# Patient Record
Sex: Female | Born: 1985 | Race: White | Hispanic: No | Marital: Single | Smoking: Never smoker
Health system: Southern US, Community
[De-identification: ages and names within clinical notes are randomized; demographics above are authoritative.]

## PROBLEM LIST (undated history)

## (undated) ENCOUNTER — Ambulatory Visit

## (undated) DIAGNOSIS — B001 Herpesviral vesicular dermatitis: Secondary | ICD-10-CM

## (undated) DIAGNOSIS — N200 Calculus of kidney: Secondary | ICD-10-CM

## (undated) DIAGNOSIS — M25561 Pain in right knee: Secondary | ICD-10-CM

## (undated) HISTORY — DX: Herpesviral vesicular dermatitis: B00.1

---
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
## 2005-03-16 ENCOUNTER — Emergency Department (HOSPITAL_COMMUNITY): Admission: EM | Admit: 2005-03-16 | Discharge: 2005-03-17 | Payer: Self-pay | Admitting: Emergency Medicine

## 2005-05-24 ENCOUNTER — Inpatient Hospital Stay (HOSPITAL_COMMUNITY): Admission: AD | Admit: 2005-05-24 | Discharge: 2005-05-24 | Payer: Self-pay | Admitting: Obstetrics and Gynecology

## 2005-09-18 ENCOUNTER — Emergency Department (HOSPITAL_COMMUNITY): Admission: EM | Admit: 2005-09-18 | Discharge: 2005-09-18 | Payer: Self-pay | Admitting: Emergency Medicine

## 2005-10-30 ENCOUNTER — Inpatient Hospital Stay (HOSPITAL_COMMUNITY): Admission: AD | Admit: 2005-10-30 | Discharge: 2005-10-30 | Payer: Self-pay | Admitting: Obstetrics and Gynecology

## 2006-01-04 ENCOUNTER — Inpatient Hospital Stay (HOSPITAL_COMMUNITY): Admission: AD | Admit: 2006-01-04 | Discharge: 2006-01-04 | Payer: Self-pay | Admitting: Obstetrics and Gynecology

## 2006-01-06 ENCOUNTER — Inpatient Hospital Stay (HOSPITAL_COMMUNITY): Admission: RE | Admit: 2006-01-06 | Discharge: 2006-01-08 | Payer: Self-pay | Admitting: Obstetrics & Gynecology

## 2006-01-22 ENCOUNTER — Encounter: Admission: RE | Admit: 2006-01-22 | Discharge: 2006-01-22 | Payer: Self-pay | Admitting: Emergency Medicine

## 2006-01-29 ENCOUNTER — Emergency Department (HOSPITAL_COMMUNITY): Admission: EM | Admit: 2006-01-29 | Discharge: 2006-01-29 | Payer: Self-pay | Admitting: Emergency Medicine

## 2006-06-25 ENCOUNTER — Emergency Department (HOSPITAL_COMMUNITY): Admission: EM | Admit: 2006-06-25 | Discharge: 2006-06-25 | Payer: Self-pay | Admitting: Emergency Medicine

## 2006-07-18 HISTORY — PX: BREAST ENHANCEMENT SURGERY: SHX7

## 2008-06-08 ENCOUNTER — Emergency Department (HOSPITAL_COMMUNITY): Admission: EM | Admit: 2008-06-08 | Discharge: 2008-06-08 | Payer: Self-pay | Admitting: Emergency Medicine

## 2010-12-03 NOTE — H&P (Signed)
[facility]  Patient:    [name], [name]                     MRN: [id number] Adm. Date:  [id number] Attending:  Genia Del                         History and Physical  DATE OF BIRTH:                Mar 18, 1986  INDICATIONS:                   [name] is a 25 year old G1, last menstrual period on February 18, 2000 for an expected date of delivery Nov 24, 2000 at 40 weeks and 6 days gestation.  REASON FOR ADMISSION:         Induction for post date.  HISTORY OF PRESENT ILLNESS:   Fetal movements positive.  No vaginal bleeding. No fluid leak.  No regular uterine contractions.  No PIH symptoms.  Last visit at the office showed a favorable cervix at 2+ cm, 80%, vertex, -1.  An ultrasound at 40 weeks showed appropriate for gestational age with amniotic fluid index within normal limits, cephalic presentation.  The decision was taken to proceed with induction for post date on Nov 30, 2000.  PAST MEDICAL HISTORY:         Positive for mild asthma.  PAST SURGICAL HISTORY:        Negative.  PAST GYNECOLOGIC HISTORY:     Negative.  No history of STDs.  Normal Paps.  FAMILY HISTORY:               Positive for bipolar disorder, thyroid dysfunction, and chronic hypertension.  MEDICATIONS:                  Prenatal vitamins.  ALLERGIES:                    No known drug allergies.  SOCIAL HISTORY:               Single but father of the baby is supportive. 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  HISTORY OF PRESENT PREGNANCY: First trimester was marked by nausea and vomiting, for which Phenergan was used.  Her labs in the first trimester showed a hemoglobin of 13.1, platelets 189, blood type Rh O positive, Rh antibody negative, RPR nonreactive, HBsAg negative, HIV nonreactive, rubella titers equivocal, gonorrhea and chlamydia negative, Pap test normal.  In the second trimester, she had a triple test within normal limits.   Ultrasound review of anatomy at [facility] was within normal limits with normal fluids and normal placenta.  A one-hour GTT at 27+ weeks was within normal limits and group B Strep was negative at 35+ weeks.  Blood pressures remained normal throughout pregnancy.  REVIEW OF SYSTEMS:            CONSTITUTIONAL:  Negative.  HEENT:  Negative. RESPIRATORY:  Negative.  CARDIOVASCULAR:  Negative.  GI:  Negative.  GU: Negative.  DERMATOLOGIC:  Negative.  NEUROLOGIC:  Negative.  ENDOCRINE: Negative.  PHYSICAL EXAMINATION:  GENERAL:                      No apparent distress.  VITAL SIGNS:                  Blood pressure on  admission was 120/75, pulse 86, respiratory rate 20, temperature 97.8.  LUNGS:                        Clear bilaterally.  HEART:                        Regular cardiac rhythm.  No murmur.  ABDOMEN:                      Soft, gravid uterus.  Cephalic presentation. Height corresponds well.  Vaginal exam:  2+ cm, 70% effaced, vertex, 0.  EXTREMITIES:                  Lower limbs:  Mild edema.  LABORATORY DATA:              Monitoring baseline 130 per minute with good variability and positive accelerations.  NST reactive.  No decelerations.  No regular uterine contractions.  IMPRESSION:                   G1 40 weeks and 6 days gestation.  Fetal well-being reassuring.  Appropriate for gestational age.  Induction for post date with favorable cervix.  PLAN:                         Induction with artificial rupture of membranes plus Pitocin, monitoring, expectant management towards probable vaginal delivery. DD:  11/30/00 TD:  11/30/00 Job: [id number]

## 2010-12-03 NOTE — Consult Note (Signed)
[name], [name]              ACCOUNT NO.:  [id number]   MEDICAL RECORD NO.:  [id number]          PATIENT TYPE:  MAT   LOCATION:  [facility]                          FACILITY:  [facility]   PHYSICIAN:  Lenoard Aden, M.D.DATE OF BIRTH:  07/28/1985   DATE OF CONSULTATION:  05/24/2005  DATE OF DISCHARGE:                                   CONSULTATION   CHIEF COMPLAINT:  Hyperemesis.   She is a 25 year old white female, G4, P1, who presents with nausea and  vomiting at [redacted] weeks gestation.   MEDICATIONS:  Emetrol and prenatal vitamins.   She is a nonsmoker, nondrinker, denies domestic or physical violence.  History of a vaginal delivery and 1 pregnancy loss.   FAMILY HISTORY:  Noncontributory.   SOCIAL HISTORY:  Noncontributory.   PHYSICAL EXAMINATION:  VITAL SIGNS:  Today stable, temperature 97.6, pulse  68, respirations 20, blood pressure 109/66.  HEENT:  Normal.  LUNGS:  Clear.  HEART:  Regular rhythm.  ABDOMEN:  Soft, nontender.  No CVA tenderness.  EXTREMITIES:  No cords.  NEUROLOGIC:  Nonfocal.  PELVIC EXAM:  Deferred.   IMPRESSION:  1.  Eight week intrauterine pregnancy.  2.  Hyperemesis.   PLAN:  IV fluids, IV Phenergan, discharge home, Phenergan prescription  called, reassurance given.      Lenoard Aden, M.D.  Electronically Signed     RJT/MEDQ  D:  05/24/2005  T:  05/24/2005  Job:  [id number]

## 2011-10-09 ENCOUNTER — Ambulatory Visit (INDEPENDENT_AMBULATORY_CARE_PROVIDER_SITE_OTHER): Payer: BC Managed Care – PPO | Admitting: Family Medicine

## 2011-10-09 VITALS — BP 112/69 | HR 92 | Temp 98.5°F | Resp 16 | Ht 66.5 in | Wt 155.8 lb

## 2011-10-09 DIAGNOSIS — J069 Acute upper respiratory infection, unspecified: Secondary | ICD-10-CM

## 2011-10-09 DIAGNOSIS — R05 Cough: Secondary | ICD-10-CM

## 2011-10-09 DIAGNOSIS — B9789 Other viral agents as the cause of diseases classified elsewhere: Secondary | ICD-10-CM

## 2011-10-09 DIAGNOSIS — J029 Acute pharyngitis, unspecified: Secondary | ICD-10-CM

## 2011-10-09 DIAGNOSIS — R5381 Other malaise: Secondary | ICD-10-CM

## 2011-10-09 LAB — POCT RAPID STREP A (OFFICE): Rapid Strep A Screen: NEGATIVE

## 2011-10-09 LAB — POCT INFLUENZA A/B: Influenza B, POC: NEGATIVE

## 2011-10-09 MED ORDER — MAGIC MOUTHWASH W/LIDOCAINE: 5.0000 mL | Freq: Four times a day (QID) | ORAL | Status: DC | PRN

## 2011-10-09 NOTE — Progress Notes (Signed)
  Patient Name: [name] Date of Birth: 07-29-1985 Medical Record Number: [id number] Gender: female Date of Encounter: 10/09/2011  History of Present Illness:  Amos Gaber is a 26 y.o. very pleasant female patient who presents with the following:  ST for 4 or 5 days.  Does have some allergies.  Now has sinus pressure and pain, left ear feels full and "like there is water" in there.  Notes that her chest feels tight.  No fever noted but has felt warm, has had chills.  Does have body aches.  Yesterday she felt like she might have the flu.  No antipyretics today.  No GI symptoms Menses now.    There is no problem list on file for this patient.  Past Medical History  Diagnosis Date  . Allergy   . Asthma    History reviewed. No pertinent past surgical history. History  Substance Use Topics  . Smoking status: Never Smoker   . Smokeless tobacco: Never Used  . Alcohol Use: Yes     occasionally   History reviewed. No pertinent family history. Allergies  Allergen Reactions  . Penicillins     childhood    Medication list has been reviewed and updated.  Review of Systems: As per HPI- otherwise negative.  Physical Examination: Filed Vitals:   10/09/11 1004  BP: 112/69  Pulse: 92  Temp: 98.5 F (36.9 C)  TempSrc: Oral  Resp: 16  Height: 5' 6.5" (1.689 m)  Weight: 155 lb 12.8 oz (70.67 kg)    Body mass index is 24.77 kg/(m^2).  GEN: WDWN, NAD, Non-toxic, A & O x 3 HEENT: Atraumatic, Normocephalic. Neck supple. No masses, No LAD.  Tm wnl bilaterally, left side of oropharynx a little bit injected but no exudate Ears and Nose: No external deformity. CV: RRR, No M/G/R. No JVD. No thrill. No extra heart sounds. PULM: CTA B, no wheezes, crackles, rhonchi. No retractions. No resp. distress. No accessory muscle use. ABD: S, NT, ND, +BS. No rebound. No HSM. EXTR: No c/c/e NEURO Normal gait.  PSYCH: Normally interactive. Conversant. Not depressed or anxious appearing.   Calm demeanor.   Results for orders placed in visit on 10/09/11  POCT INFLUENZA A/B      Component Value Range   Influenza A, POC Negative     Influenza B, POC Negative    POCT RAPID STREP A (OFFICE)      Component Value Range   Rapid Strep A Screen Negative  Negative     Assessment and Plan: 1. Malaise  POCT Influenza A/B, POCT rapid strep A  2. Cough    3. Sore throat  Alum & Mag Hydroxide-Simeth (MAGIC MOUTHWASH W/LIDOCAINE) SOLN   Suspect viral URI.  Symptomatic treatment as needed.  Patient (or parent if minor) instructed to return to clinic or call if not better in 2 day(s).

## 2011-10-12 ENCOUNTER — Ambulatory Visit (INDEPENDENT_AMBULATORY_CARE_PROVIDER_SITE_OTHER): Payer: BC Managed Care – PPO | Admitting: Internal Medicine

## 2011-10-12 VITALS — BP 98/94 | HR 64 | Temp 98.4°F | Resp 18 | Wt 156.0 lb

## 2011-10-12 DIAGNOSIS — H6690 Otitis media, unspecified, unspecified ear: Secondary | ICD-10-CM

## 2011-10-12 DIAGNOSIS — J45909 Unspecified asthma, uncomplicated: Secondary | ICD-10-CM

## 2011-10-12 DIAGNOSIS — H6692 Otitis media, unspecified, left ear: Secondary | ICD-10-CM

## 2011-10-12 MED ORDER — AMOXICILLIN 875 MG PO TABS: ORAL_TABLET | ORAL | Status: DC

## 2011-10-12 MED ORDER — METHYLPREDNISOLONE ACETATE 80 MG/ML IJ SUSP
80.0000 mg | Freq: Once | INTRAMUSCULAR | Status: AC
  Administered 2011-10-12: 80 mg via INTRAMUSCULAR

## 2011-10-12 MED ORDER — ALBUTEROL SULFATE HFA 108 (90 BASE) MCG/ACT IN AERS: 2.0000 | INHALATION_SPRAY | Freq: Four times a day (QID) | RESPIRATORY_TRACT | Status: AC | PRN

## 2011-10-12 MED ORDER — ALBUTEROL SULFATE (2.5 MG/3ML) 0.083% IN NEBU
2.5000 mg | INHALATION_SOLUTION | Freq: Once | RESPIRATORY_TRACT | Status: AC
  Administered 2011-10-12: 2.5 mg via RESPIRATORY_TRACT

## 2011-10-12 MED ORDER — PREDNISONE 20 MG PO TABS: ORAL_TABLET | ORAL | Status: DC

## 2011-10-12 NOTE — Progress Notes (Signed)
  Subjective:    Patient ID: [name], female    DOB: 1986/03/05, 26 y.o.   MRN: [id number]  Otalgia  There is pain in the left ear. This is a new problem. The current episode started yesterday. The problem occurs constantly. There has been no fever. Associated symptoms include coughing.  [name] comes in complaining of a worsening of her cough and severe left ear pain that started last evening about 7 pm.  She has a remote history of asthma, does not have an inhaler at home, has not needed one in recent years.  She has not had a fever or vomiting.  She also tells me she has taken Amoxicillin in recent years without it causing any side effects.    Review of Systems  HENT: Positive for ear pain.   Respiratory: Positive for cough.   All other systems reviewed and are negative.       Objective:   Physical Exam  Vitals reviewed. Constitutional: She is oriented to person, place, and time. She appears well-developed and well-nourished.  HENT:  Head: Normocephalic.  Mouth/Throat: Oropharynx is clear and moist. No oropharyngeal exudate.       Left TM with bullous myringitis  Neck: Neck supple.  Cardiovascular: Normal rate, regular rhythm and normal heart sounds.   Pulmonary/Chest: Effort normal and breath sounds normal.  Abdominal: Soft.  Lymphadenopathy:    She has no cervical adenopathy.  Neurological: She is alert and oriented to person, place, and time.  Skin: Skin is warm and dry.  Psychiatric: She has a normal mood and affect. Her behavior is normal.          Assessment & Plan:  Left Bullous Myringitis:  Depomedrol 80 mg IM now.  Amoxicillin 875 2 po BID for 10 days.  Prednisone taper 60-60-40-40-20-20.  RTC if not better in 2 days. RAD:  Albuterol inhaler given for cough.

## 2011-10-12 NOTE — Patient Instructions (Signed)
Take your prednisone taper with food as directed along with your AMoxicillin for 10 days.  You should be feeling better in 2 -3 days.  Return to our clinic if you are not feeling better.  Otitis Media, Adult A middle ear infection is an infection in the space behind the eardrum. It often happens along with a cold. It is caused by a germ that starts growing in that space. Your neck may feel puffy (swollen) on the side of the ear infection. HOME CARE  Take your medicine as told. Finish it even if you start to feel better.   Nose medicine (nasal decongestant) may help the tube that connects the ear and throat (eustachian tube) drain better. It may also help with discomfort.   Follow up with your doctor in 10 to 14 days or as told by your doctor. This is to make sure the infection is gone.  GET HELP RIGHT AWAY IF:   You do not start to feel better in 2 to 3 days.   You have pain that is not helped with medicine.   You cannot use the medicine as told.   You feel worse instead of better.   You develop puffiness, redness, or pain around the ear.   You get a stiff neck.  MAKE SURE YOU:   Understand these instructions.   Will watch your condition.   Will get help right away if you are not doing well or get worse.  Document Released: 12/21/2007 Document Revised: 06/23/2011 Document Reviewed: 12/21/2007 [facility] Patient Information 2012 [place], [place].

## 2012-02-09 ENCOUNTER — Other Ambulatory Visit: Payer: Self-pay | Admitting: Physician Assistant

## 2012-08-26 ENCOUNTER — Ambulatory Visit (INDEPENDENT_AMBULATORY_CARE_PROVIDER_SITE_OTHER): Payer: BC Managed Care – PPO | Admitting: Physician Assistant

## 2012-08-26 VITALS — BP 93/61 | HR 66 | Temp 97.4°F | Resp 16 | Ht 66.5 in | Wt 152.2 lb

## 2012-08-26 DIAGNOSIS — J019 Acute sinusitis, unspecified: Secondary | ICD-10-CM

## 2012-08-26 DIAGNOSIS — J309 Allergic rhinitis, unspecified: Secondary | ICD-10-CM

## 2012-08-26 DIAGNOSIS — J3489 Other specified disorders of nose and nasal sinuses: Secondary | ICD-10-CM

## 2012-08-26 DIAGNOSIS — R0981 Nasal congestion: Secondary | ICD-10-CM

## 2012-08-26 MED ORDER — AMOXICILLIN-POT CLAVULANATE 875-125 MG PO TABS: 1.0000 | ORAL_TABLET | Freq: Two times a day (BID) | ORAL | Status: DC

## 2012-08-26 MED ORDER — IPRATROPIUM BROMIDE 0.03 % NA SOLN: 2.0000 | Freq: Two times a day (BID) | NASAL | Status: DC

## 2012-08-26 MED ORDER — POLYMYXIN B-TRIMETHOPRIM 10000-0.1 UNIT/ML-% OP SOLN: 1.0000 [drp] | OPHTHALMIC | Status: DC

## 2012-08-26 NOTE — Progress Notes (Signed)
  Subjective:    Patient ID: [name], female    DOB: 10-21-1985, 27 y.o.   MRN: [id number]  HPI 27 year old female presents with 2 week history of nasal congestion, sinus pressure, dry, nonproductive cough, and thick nasal drainage.  She has been self treating at home with OTC cold preparations but symptoms have continued to worsen. Denies fever, chills, nausea, vomiting, headache, dizziness, otalgia, or abdominal pain.  She does admit that this morning her eyes were matted shut and have continued to drain purulent drainage since then.  She is otherwise healthy. Is taking Accutane for acne and doing well on it.  No other known medical problems.      Review of Systems  Constitutional: Negative for fever and chills.  HENT: Positive for congestion, rhinorrhea and postnasal drip. Negative for ear pain, sore throat and neck pain.   Eyes: Positive for pain and discharge. Negative for photophobia and visual disturbance.  Respiratory: Positive for cough. Negative for shortness of breath and wheezing.   Cardiovascular: Negative for chest pain.  Gastrointestinal: Negative for nausea, vomiting and abdominal pain.  Neurological: Negative for dizziness, light-headedness and headaches.  All other systems reviewed and are negative.       Objective:   Physical Exam  Constitutional: She is oriented to person, place, and time. She appears well-developed and well-nourished.  HENT:  Head: Normocephalic and atraumatic.  Right Ear: Tympanic membrane, external ear and ear canal normal.  Left Ear: Hearing, tympanic membrane, external ear and ear canal normal.  Nose: Right sinus exhibits maxillary sinus tenderness. Right sinus exhibits no frontal sinus tenderness. Left sinus exhibits maxillary sinus tenderness. Left sinus exhibits no frontal sinus tenderness.  Mouth/Throat: Uvula is midline, oropharynx is clear and moist and mucous membranes are normal. No oropharyngeal exudate, posterior oropharyngeal  edema, posterior oropharyngeal erythema or tonsillar abscesses.  Eyes: Conjunctivae are normal.  Neck: Normal range of motion.  Cardiovascular: Normal rate, regular rhythm and normal heart sounds.   Pulmonary/Chest: Effort normal and breath sounds normal.  Lymphadenopathy:    She has no cervical adenopathy.  Neurological: She is alert and oriented to person, place, and time.  Psychiatric: She has a normal mood and affect. Her behavior is normal. Judgment and thought content normal.          Assessment & Plan:   1. Nasal congestion  trimethoprim-polymyxin b (POLYTRIM) ophthalmic solution   trimethoprim-polymyxin b (POLYTRIM) ophthalmic solution   ipratropium (ATROVENT) 0.03 % nasal spray  2. Acute sinus infection  amoxicillin-clavulanate (AUGMENTIN) 875-125 MG per tablet   amoxicillin-clavulanate (AUGMENTIN) 875-125 MG per tablet  3. Allergic rhinitis     Will treat for sinus infection - per patient she is able to tolerate Augmentin without a problem.  Start Augmentin 875 mg bid Atrovent NS bid for congestion Patient not a contact wearer so will start Polytrim as directed She does admit to seasonal allergies but is not currently treating them at this time. I have instructed her to start a daily zytec, claritin, or allegra. If symptoms persist ok to send rx for flonase to try.   Follow up if symptoms worsen or fail to improve.

## 2014-05-18 DIAGNOSIS — M25561 Pain in right knee: Secondary | ICD-10-CM

## 2014-05-18 HISTORY — DX: Pain in right knee: M25.561

## 2014-05-30 ENCOUNTER — Other Ambulatory Visit: Payer: Self-pay | Admitting: Orthopedic Surgery

## 2014-05-30 ENCOUNTER — Encounter (HOSPITAL_BASED_OUTPATIENT_CLINIC_OR_DEPARTMENT_OTHER): Payer: Self-pay | Admitting: *Deleted

## 2014-06-02 ENCOUNTER — Ambulatory Visit (HOSPITAL_BASED_OUTPATIENT_CLINIC_OR_DEPARTMENT_OTHER): Payer: BC Managed Care – PPO | Admitting: Anesthesiology

## 2014-06-02 ENCOUNTER — Ambulatory Visit (HOSPITAL_BASED_OUTPATIENT_CLINIC_OR_DEPARTMENT_OTHER)
Admission: RE | Admit: 2014-06-02 | Discharge: 2014-06-02 | Disposition: A | Discharge status: Home / Self Care | Payer: BC Managed Care – PPO | Source: Ambulatory Visit | Attending: Orthopedic Surgery | Admitting: Orthopedic Surgery

## 2014-06-02 ENCOUNTER — Encounter (HOSPITAL_BASED_OUTPATIENT_CLINIC_OR_DEPARTMENT_OTHER)
Admission: RE | Disposition: A | Discharge status: Home / Self Care | Payer: Self-pay | Source: Ambulatory Visit | Attending: Orthopedic Surgery

## 2014-06-02 ENCOUNTER — Encounter (HOSPITAL_BASED_OUTPATIENT_CLINIC_OR_DEPARTMENT_OTHER): Payer: Self-pay

## 2014-06-02 DIAGNOSIS — M6751 Plica syndrome, right knee: Secondary | ICD-10-CM | POA: Insufficient documentation

## 2014-06-02 DIAGNOSIS — M94261 Chondromalacia, right knee: Secondary | ICD-10-CM | POA: Insufficient documentation

## 2014-06-02 DIAGNOSIS — Z880 Allergy status to penicillin: Secondary | ICD-10-CM | POA: Diagnosis not present

## 2014-06-02 HISTORY — PX: CHONDROPLASTY: SHX5177

## 2014-06-02 HISTORY — PX: KNEE ARTHROSCOPY WITH EXCISION PLICA: SHX5647

## 2014-06-02 HISTORY — DX: Pain in right knee: M25.561

## 2014-06-02 LAB — POCT HEMOGLOBIN-HEMACUE: Hemoglobin: 13.7 g/dL (ref 12.0–15.0)

## 2014-06-02 SURGERY — CHONDROPLASTY
Anesthesia: General | Laterality: Right

## 2014-06-02 MED ORDER — HYDROMORPHONE HCL 1 MG/ML IJ SOLN
INTRAMUSCULAR | Status: AC
  Filled 2014-06-02: qty 1

## 2014-06-02 MED ORDER — FENTANYL CITRATE 0.05 MG/ML IJ SOLN
INTRAMUSCULAR | Status: DC | PRN
  Administered 2014-06-02: 100 ug via INTRAVENOUS
  Administered 2014-06-02: 50 ug via INTRAVENOUS

## 2014-06-02 MED ORDER — LACTATED RINGERS IV SOLN
INTRAVENOUS | Status: DC
  Administered 2014-06-02 (×2): via INTRAVENOUS
  Administered 2014-06-02: 10 mL/h via INTRAVENOUS

## 2014-06-02 MED ORDER — LIDOCAINE HCL (CARDIAC) 20 MG/ML IV SOLN
INTRAVENOUS | Status: DC | PRN
  Administered 2014-06-02: 100 mg via INTRAVENOUS

## 2014-06-02 MED ORDER — CLINDAMYCIN PHOSPHATE 900 MG/50ML IV SOLN
900.0000 mg | INTRAVENOUS | Status: AC
  Administered 2014-06-02: 900 mg via INTRAVENOUS

## 2014-06-02 MED ORDER — SCOPOLAMINE 1 MG/3DAYS TD PT72
MEDICATED_PATCH | TRANSDERMAL | Status: AC
  Filled 2014-06-02: qty 1

## 2014-06-02 MED ORDER — MIDAZOLAM HCL 2 MG/2ML IJ SOLN
INTRAMUSCULAR | Status: AC
  Filled 2014-06-02: qty 2

## 2014-06-02 MED ORDER — FENTANYL CITRATE 0.05 MG/ML IJ SOLN: 50.0000 ug | INTRAMUSCULAR | Status: DC | PRN

## 2014-06-02 MED ORDER — EPINEPHRINE HCL 1 MG/ML IJ SOLN
INTRAMUSCULAR | Status: AC
  Filled 2014-06-02: qty 1

## 2014-06-02 MED ORDER — MIDAZOLAM HCL 2 MG/2ML IJ SOLN: 1.0000 mg | INTRAMUSCULAR | Status: DC | PRN

## 2014-06-02 MED ORDER — ONDANSETRON HCL 4 MG/2ML IJ SOLN: 4.0000 mg | Freq: Once | INTRAMUSCULAR | Status: DC | PRN

## 2014-06-02 MED ORDER — ONDANSETRON HCL 4 MG/2ML IJ SOLN
INTRAMUSCULAR | Status: DC | PRN
Start: 2014-06-02 — End: 2014-06-02
  Administered 2014-06-02: 4 mg via INTRAVENOUS

## 2014-06-02 MED ORDER — MIDAZOLAM HCL 5 MG/5ML IJ SOLN
INTRAMUSCULAR | Status: DC | PRN
  Administered 2014-06-02: 2 mg via INTRAVENOUS

## 2014-06-02 MED ORDER — ACETAMINOPHEN 500 MG PO TABS
1000.0000 mg | ORAL_TABLET | Freq: Four times a day (QID) | ORAL | Status: AC | PRN
  Administered 2014-06-02: 1000 mg via ORAL

## 2014-06-02 MED ORDER — PROPOFOL 10 MG/ML IV BOLUS
INTRAVENOUS | Status: AC
  Filled 2014-06-02: qty 20

## 2014-06-02 MED ORDER — HYDROMORPHONE HCL 1 MG/ML IJ SOLN
0.2500 mg | INTRAMUSCULAR | Status: DC | PRN
  Administered 2014-06-02 (×3): 0.5 mg via INTRAVENOUS

## 2014-06-02 MED ORDER — OXYCODONE-ACETAMINOPHEN 5-325 MG PO TABS: 1.0000 | ORAL_TABLET | Freq: Four times a day (QID) | ORAL | Status: DC | PRN

## 2014-06-02 MED ORDER — BUPIVACAINE HCL (PF) 0.5 % IJ SOLN
INTRAMUSCULAR | Status: AC
  Filled 2014-06-02: qty 30

## 2014-06-02 MED ORDER — SCOPOLAMINE 1 MG/3DAYS TD PT72
1.0000 | MEDICATED_PATCH | TRANSDERMAL | Status: DC
Start: 2014-06-02 — End: 2014-06-02

## 2014-06-02 MED ORDER — EPINEPHRINE HCL 1 MG/ML IJ SOLN
INTRAMUSCULAR | Status: DC | PRN
  Administered 2014-06-02: 1 mg

## 2014-06-02 MED ORDER — OXYCODONE HCL 5 MG/5ML PO SOLN: 5.0000 mg | Freq: Once | ORAL | Status: DC | PRN

## 2014-06-02 MED ORDER — FENTANYL CITRATE 0.05 MG/ML IJ SOLN
INTRAMUSCULAR | Status: AC
  Filled 2014-06-02: qty 4

## 2014-06-02 MED ORDER — BUPIVACAINE HCL (PF) 0.5 % IJ SOLN
INTRAMUSCULAR | Status: DC | PRN
  Administered 2014-06-02: 20 mL

## 2014-06-02 MED ORDER — KETOROLAC TROMETHAMINE 30 MG/ML IJ SOLN
INTRAMUSCULAR | Status: DC | PRN
  Administered 2014-06-02: 30 mg via INTRAVENOUS

## 2014-06-02 MED ORDER — SODIUM CHLORIDE 0.9 % IR SOLN
Status: DC | PRN
  Administered 2014-06-02: 3000 mL

## 2014-06-02 MED ORDER — DEXAMETHASONE SODIUM PHOSPHATE 4 MG/ML IJ SOLN
INTRAMUSCULAR | Status: DC | PRN
  Administered 2014-06-02: 10 mg via INTRAVENOUS

## 2014-06-02 MED ORDER — POVIDONE-IODINE 7.5 % EX SOLN: Freq: Once | CUTANEOUS | Status: DC

## 2014-06-02 MED ORDER — MIDAZOLAM HCL 2 MG/ML PO SYRP: 12.0000 mg | ORAL_SOLUTION | Freq: Once | ORAL | Status: DC | PRN

## 2014-06-02 MED ORDER — PROPOFOL 10 MG/ML IV BOLUS
INTRAVENOUS | Status: DC | PRN
  Administered 2014-06-02: 250 mg via INTRAVENOUS

## 2014-06-02 MED ORDER — ACETAMINOPHEN 500 MG PO TABS
ORAL_TABLET | ORAL | Status: AC
  Filled 2014-06-02: qty 2

## 2014-06-02 MED ORDER — OXYCODONE HCL 5 MG PO TABS: 5.0000 mg | ORAL_TABLET | Freq: Once | ORAL | Status: DC | PRN

## 2014-06-02 SURGICAL SUPPLY — 43 items
BANDAGE ELASTIC 4 VELCRO ST LF (GAUZE/BANDAGES/DRESSINGS) ×2 IMPLANT
BANDAGE ELASTIC 6 VELCRO ST LF (GAUZE/BANDAGES/DRESSINGS) ×3 IMPLANT
BLADE 4.2CUDA (BLADE) IMPLANT
BLADE GREAT WHITE 4.2 (BLADE) ×2 IMPLANT
BLADE GREAT WHITE 4.2MM (BLADE) ×1
BNDG COHESIVE 4X5 TAN STRL (GAUZE/BANDAGES/DRESSINGS) ×2 IMPLANT
CANISTER SUCT 3000ML (MISCELLANEOUS) IMPLANT
CUTTER MENISCUS  4.2MM (BLADE)
CUTTER MENISCUS 4.2MM (BLADE) IMPLANT
DRAPE ARTHROSCOPY W/POUCH 114 (DRAPES) ×3 IMPLANT
DRSG EMULSION OIL 3X3 NADH (GAUZE/BANDAGES/DRESSINGS) ×3 IMPLANT
DURAPREP 26ML APPLICATOR (WOUND CARE) ×3 IMPLANT
ELECT MENISCUS 165MM 90D (ELECTRODE) IMPLANT
ELECT REM PT RETURN 9FT ADLT (ELECTROSURGICAL)
ELECTRODE REM PT RTRN 9FT ADLT (ELECTROSURGICAL) IMPLANT
GAUZE SPONGE 4X4 12PLY STRL (GAUZE/BANDAGES/DRESSINGS) ×3 IMPLANT
GLOVE BIOGEL PI IND STRL 8 (GLOVE) ×2 IMPLANT
GLOVE BIOGEL PI INDICATOR 8 (GLOVE) ×4
GLOVE ECLIPSE 7.5 STRL STRAW (GLOVE) ×6 IMPLANT
GOWN STRL REUS W/ TWL LRG LVL3 (GOWN DISPOSABLE) ×1 IMPLANT
GOWN STRL REUS W/ TWL XL LVL3 (GOWN DISPOSABLE) ×1 IMPLANT
GOWN STRL REUS W/TWL LRG LVL3 (GOWN DISPOSABLE) ×3
GOWN STRL REUS W/TWL XL LVL3 (GOWN DISPOSABLE) ×6 IMPLANT
HOLDER KNEE FOAM BLUE (MISCELLANEOUS) ×3 IMPLANT
KNEE WRAP E Z 3 GEL PACK (MISCELLANEOUS) ×3 IMPLANT
MANIFOLD NEPTUNE II (INSTRUMENTS) ×2 IMPLANT
NDL SAFETY ECLIPSE 18X1.5 (NEEDLE) IMPLANT
NEEDLE HYPO 18GX1.5 SHARP (NEEDLE)
PACK ARTHROSCOPY DSU (CUSTOM PROCEDURE TRAY) ×3 IMPLANT
PACK BASIN DAY SURGERY FS (CUSTOM PROCEDURE TRAY) ×3 IMPLANT
PAD CAST 4YDX4 CTTN HI CHSV (CAST SUPPLIES) ×1 IMPLANT
PADDING CAST ABS 4INX4YD NS (CAST SUPPLIES) ×2
PADDING CAST ABS COTTON 4X4 ST (CAST SUPPLIES) IMPLANT
PADDING CAST COTTON 4X4 STRL (CAST SUPPLIES) ×3
PENCIL BUTTON HOLSTER BLD 10FT (ELECTRODE) IMPLANT
SET ARTHROSCOPY TUBING (MISCELLANEOUS) ×3
SET ARTHROSCOPY TUBING LN (MISCELLANEOUS) ×1 IMPLANT
SPONGE GAUZE 4X4 12PLY STER LF (GAUZE/BANDAGES/DRESSINGS) ×2 IMPLANT
SUT ETHILON 4 0 PS 2 18 (SUTURE) IMPLANT
SYR 5ML LL (SYRINGE) ×3 IMPLANT
TOWEL OR 17X24 6PK STRL BLUE (TOWEL DISPOSABLE) ×3 IMPLANT
TOWEL OR NON WOVEN STRL DISP B (DISPOSABLE) ×3 IMPLANT
WATER STERILE IRR 1000ML POUR (IV SOLUTION) ×3 IMPLANT

## 2014-06-02 NOTE — Transfer of Care (Signed)
Immediate Anesthesia Transfer of Care Note  Patient: [name]  Procedure(s) Performed: Procedure(s) with comments: ARTHROSCOPY KNEE (Right) - Right knee arthroscopy CHONDROPLASTY lateral medial condyle (Right)  Patient Location: PACU  Anesthesia Type:General  Level of Consciousness: awake and sedated  Airway & Oxygen Therapy: Patient Spontanous Breathing and Patient connected to face mask oxygen  Post-op Assessment: Report given to PACU RN and Post -op Vital signs reviewed and stable  Post vital signs: Reviewed and stable  Complications: No apparent anesthesia complications

## 2014-06-02 NOTE — Brief Op Note (Signed)
06/02/2014  3:54 PM  PATIENT:  [name]  28 y.o. female  PRE-OPERATIVE DIAGNOSIS:  Right knee pain  POST-OPERATIVE DIAGNOSIS:  Right knee pain  PROCEDURE:  Procedure(s) with comments: ARTHROSCOPY KNEE (Right) - Right knee arthroscopy CHONDROPLASTY lateral medial condyle (Right)  SURGEON:  Surgeon(s) and Role:    * Harvie JuniorJohn L Deepak Bless, MD - Primary  PHYSICIAN ASSISTANT:   ASSISTANTS: bethune   ANESTHESIA:   general  EBL:  Total I/O In: 700 [I.V.:700] Out: -   BLOOD ADMINISTERED:none  DRAINS: none   LOCAL MEDICATIONS USED:  MARCAINE     SPECIMEN:  No Specimen  DISPOSITION OF SPECIMEN:  N/A  COUNTS:  YES  TOURNIQUET:  * No tourniquets in log *  DICTATION: .Other Dictation: Dictation Number missed dict number  PLAN OF CARE: Discharge to home after PACU  PATIENT DISPOSITION:  PACU - hemodynamically stable.   Delay start of Pharmacological VTE agent (>24hrs) due to surgical blood loss or risk of bleeding: no

## 2014-06-02 NOTE — Pre-Procedure Instructions (Signed)
Pt. called to say that her period started today; will cancel pregnancy test.

## 2014-06-02 NOTE — Anesthesia Preprocedure Evaluation (Signed)
Anesthesia Evaluation  Patient identified by MRN, date of birth, ID band Patient awake    Reviewed: Allergy & Precautions, H&P , NPO status , Patient's Chart, lab work & pertinent test results, Unable to perform ROS - Chart review only  Airway Mallampati: I  TM Distance: >3 FB Neck ROM: Full    Dental  (+) Teeth Intact, Dental Advisory Given   Pulmonary  breath sounds clear to auscultation        Cardiovascular Rhythm:Regular Rate:Normal     Neuro/Psych    GI/Hepatic   Endo/Other    Renal/GU      Musculoskeletal   Abdominal   Peds  Hematology   Anesthesia Other Findings   Reproductive/Obstetrics                             Anesthesia Physical Anesthesia Plan  ASA: I  Anesthesia Plan: General   Post-op Pain Management:    Induction: Intravenous  Airway Management Planned: LMA  Additional Equipment:   Intra-op Plan:   Post-operative Plan: Extubation in OR  Informed Consent: I have reviewed the patients History and Physical, chart, labs and discussed the procedure including the risks, benefits and alternatives for the proposed anesthesia with the patient or authorized representative who has indicated his/her understanding and acceptance.   Dental advisory given  Plan Discussed with: CRNA, Anesthesiologist and Surgeon  Anesthesia Plan Comments:         Anesthesia Quick Evaluation

## 2014-06-02 NOTE — Discharge Instructions (Signed)

## 2014-06-02 NOTE — H&P (Signed)
PREOPERATIVE H&P  Chief Complaint: r knee pain  HPI: [name] is a 28 y.o. female who presents for evaluation of r knee pain. It has been present for greater than 3 months and has been worsening. She has failed conservative measures. Pain is rated as moderate.  Past Medical History  Diagnosis Date  . Knee pain, right 05/2014   Past Surgical History  Procedure Laterality Date  . Breast enhancement surgery Bilateral 2008   History   Social History  . Marital Status: Married    Spouse Name: N/A    Number of Children: N/A  . Years of Education: N/A   Social History Main Topics  . Smoking status: Never Smoker   . Smokeless tobacco: Never Used  . Alcohol Use: No  . Drug Use: No  . Sexual Activity: None   Other Topics Concern  . None   Social History Narrative   History reviewed. No pertinent family history. Allergies  Allergen Reactions  . Penicillins Hives   Prior to Admission medications   Medication Sig Start Date End Date Taking? Authorizing Provider  acetaminophen (TYLENOL) 325 MG tablet Take 650 mg by mouth every 6 (six) hours as needed.   Yes Historical Provider, MD     Positive ROS: none  All other systems have been reviewed and were otherwise negative with the exception of those mentioned in the HPI and as above.  Physical Exam: Filed Vitals:   06/02/14 1244  BP: 117/69  Pulse: 64  Temp: 98.4 F (36.9 C)  Resp: 20    General: Alert, no acute distress Cardiovascular: No pedal edema Respiratory: No cyanosis, no use of accessory musculature GI: No organomegaly, abdomen is soft and non-tender Skin: No lesions in the area of chief complaint Neurologic: Sensation intact distally Psychiatric: Patient is competent for consent with normal mood and affect Lymphatic: No axillary or cervical lymphadenopathy  MUSCULOSKELETAL: r knee: painful rom Med jt line tender // no instability  Assessment/Plan: Right knee pain Plan for  Procedure(s): ARTHROSCOPY KNEE  The risks benefits and alternatives were discussed with the patient including but not limited to the risks of nonoperative treatment, versus surgical intervention including infection, bleeding, nerve injury, malunion, nonunion, hardware prominence, hardware failure, need for hardware removal, blood clots, cardiopulmonary complications, morbidity, mortality, among others, and they were willing to proceed.  Predicted outcome is good, although there will be at least a six to nine month expected recovery.  Angelea Penny L, MD 06/02/2014 3:03 PM

## 2014-06-02 NOTE — Anesthesia Postprocedure Evaluation (Signed)
Anesthesia Post Note  Patient: [name]  Procedure(s) Performed: Procedure(s) (LRB): CHONDROPLASTY lateral medial condyle (Right) KNEE ARTHROSCOPY WITH EXCISION PLICA (Right)  Anesthesia type: General  Patient location: PACU  Post pain: Pain level controlled and Adequate analgesia  Post assessment: Post-op Vital signs reviewed, Patient's Cardiovascular Status Stable, Respiratory Function Stable, Patent Airway and Pain level controlled  Last Vitals:  Filed Vitals:   06/02/14 1630  BP: 112/70  Pulse: 61  Temp:   Resp: 11    Post vital signs: Reviewed and stable  Level of consciousness: awake, alert  and oriented  Complications: No apparent anesthesia complications

## 2014-06-02 NOTE — Anesthesia Procedure Notes (Signed)
Procedure Name: LMA Insertion Performed by: Jhalil Silvera W Pre-anesthesia Checklist: Patient identified, Timeout performed, Emergency Drugs available, Suction available and Patient being monitored Patient Re-evaluated:Patient Re-evaluated prior to inductionOxygen Delivery Method: Circle system utilized Preoxygenation: Pre-oxygenation with 100% oxygen Intubation Type: IV induction Ventilation: Mask ventilation without difficulty LMA: LMA inserted LMA Size: 4.0 Number of attempts: 1 Placement Confirmation: positive ETCO2 Tube secured with: Tape Dental Injury: Teeth and Oropharynx as per pre-operative assessment      

## 2014-06-03 ENCOUNTER — Encounter (HOSPITAL_BASED_OUTPATIENT_CLINIC_OR_DEPARTMENT_OTHER): Payer: Self-pay | Admitting: Orthopedic Surgery

## 2014-06-03 NOTE — Op Note (Signed)
NAMJunious Silk:  [name], [name]              ACCOUNT NO.:  [id number]  MEDICAL RECORD NO.:  [id number]  LOCATION:                                 FACILITY:  PHYSICIAN:  [name], M.D.        DATE OF BIRTH:  DATE OF PROCEDURE:  06/02/2014 DATE OF DISCHARGE:  06/02/2014                              OPERATIVE REPORT   PREOPERATIVE DIAGNOSIS:  Chondral flap with questionable plica.  POSTOPERATIVE DIAGNOSES: 1. Lateral condyle condylar flap. 2. Medial and lateral shelf plica.  PRINCIPAL PROCEDURES: 1. Chondroplasty of lateral femoral condyle. 2. Medial and lateral plica excision.  SURGEON:  [name], M.D.  ASSISTANT:  [name], P.A.  ANESTHESIA:  General.  BRIEF HISTORY:  [name] is a 28 year old female with history of having significant complaints of right knee pain.  She had been treated conservatively for prolonged period of time.  Injection therapy had failed activity modification and medication had failed, and after failure of conservative care and continued locking and catching, she was taken to the operating room for right knee surgery.  DESCRIPTION OF PROCEDURE:  The patient was taken to the operating room. After adequate anesthesia was obtained with general anesthetic, the patient was placed supine on the operating table.  The right leg was prepped and draped in usual sterile fashion.  Following this, routine arthroscopic examination of the knee revealed that there was no significant chondromalacia of the patellofemoral joint.  There was a large medial shelf plica, which was draping and injuring the medial femoral condyle.  This was debrided back to a smooth and stable rim on the medial capsule.  Attention was turned to the medial meniscus, which look like has had some injury posteriorly, but did not appear as though it was detached at all, did not have an undersurface rent.  At this point, we probed it thoroughly and felt that it was satisfactory. Probed  the medial femoral condyle at length, no evidence of loose flap or chondral flap.  Attention was turned to the lateral compartment where the lateral femoral condyle had a cartilaginous flap, which was debrided back to a smooth and stable rim.  The lateral tibial plateau had some mild cartilage irregularity as well.  Lateral meniscus was normal. Attention was turned back up to the patellofemoral joint where the lateral plica was encountered and this was debrided back to a smooth and stable rim.  Once this was completed, the knee was copiously and thoroughly lavaged, suctioned, dried.  The arthroscopic portals were closed with bandage.  Sterile compressive dressing was applied.  The patient was taken to the recovery, she was noted to be in satisfactory condition.  20 mL of 1.25% Marcaine was instilled in the knee for postoperative anesthesia.     Tammy JuniorJohn L. Imelda Dandridge, M.D.     Ranae PlumberJLG/MEDQ  D:  06/02/2014  T:  06/03/2014  Job:  [id number]

## 2014-12-25 ENCOUNTER — Emergency Department (HOSPITAL_COMMUNITY)
Admission: EM | Admit: 2014-12-25 | Discharge: 2014-12-25 | Disposition: A | Discharge status: Home / Self Care | Payer: Self-pay | Attending: Emergency Medicine | Admitting: Emergency Medicine

## 2014-12-25 ENCOUNTER — Encounter (HOSPITAL_COMMUNITY): Payer: Self-pay | Admitting: Emergency Medicine

## 2014-12-25 DIAGNOSIS — Z880 Allergy status to penicillin: Secondary | ICD-10-CM | POA: Insufficient documentation

## 2014-12-25 DIAGNOSIS — L259 Unspecified contact dermatitis, unspecified cause: Secondary | ICD-10-CM | POA: Insufficient documentation

## 2014-12-25 MED ORDER — PREDNISONE 20 MG PO TABS
ORAL_TABLET | ORAL | Status: DC
Start: 2014-12-25 — End: 2016-06-18

## 2014-12-25 MED ORDER — DIPHENHYDRAMINE HCL 25 MG PO TABS: 25.0000 mg | ORAL_TABLET | Freq: Three times a day (TID) | ORAL | Status: DC | PRN

## 2014-12-25 NOTE — ED Notes (Signed)
Pt has rash/bumps to bilateral legs and arms. Pt c/o itching and painful to touch.

## 2014-12-25 NOTE — Discharge Instructions (Signed)

## 2014-12-25 NOTE — ED Provider Notes (Signed)
CSN: [id number]     Arrival date & time 12/25/14  1404 History  This chart was scribed for non-physician practitioner Fayrene Helper, PA, working with Richardean Canal, MD, by Tanda Rockers, ED Scribe. This patient was seen in room WTR6/WTR6 and the patient's care was started at 2:46 PM.    Chief Complaint  Patient presents with  . Rash   The history is provided by the patient. No language interpreter was used.     HPI Comments: [name] is a 29 y.o. female who presents to the Emergency Department complaining of rash to BLEs and BUEs x 4 days. Pt notes that she had itching to the areas this weekend but that the rash started afterwards. She describes the rash as itching, burning, and painful to the touch. Pt believes the rash started on her legs and spread to her arms. She notes mild drainage to the rash with yellow purulence. She mentions that her husband had poison ivy exposure approximately 2 weeks ago after working in the yard but that his rash has since resolved on its own. She notes that she was also working outside at that time but did not have any symptoms until recently. She was wearing a tanktop and yoga pants at that time but has not worn these articles of clothing since. Husband was sleeping in a separate room and using separate linens to avoid exposure. Pt has tried Calamine lotion, washing with soap, and taking Benadryl without relief. Denies any recent changes in lotions, soaps, body wash, creams, or medications. No recent foregin travel or recent exposure to bodies of water. No rash to palms of hands or soles of feet. Denies fever, chills, headache, diaphoresis, myalgias, joint pain, nausea, vomiting, shortness of breath, difficulty urinating or having a bowel movement, lesions to vaginal area, or any other symptoms.   Past Medical History  Diagnosis Date  . Knee pain, right 05/2014   Past Surgical History  Procedure Laterality Date  . Breast enhancement surgery Bilateral 2008  .  Chondroplasty Right 06/02/2014    Procedure: CHONDROPLASTY lateral medial condyle;  Surgeon: Harvie Junior, MD;  Location: [facility];  Service: Orthopedics;  Laterality: Right;  . Knee arthroscopy with excision plica Right 06/02/2014    Procedure: KNEE ARTHROSCOPY WITH EXCISION PLICA;  Surgeon: Harvie Junior, MD;  Location: [facility];  Service: Orthopedics;  Laterality: Right;   No family history on file. History  Substance Use Topics  . Smoking status: Never Smoker   . Smokeless tobacco: Never Used  . Alcohol Use: No   OB History    No data available     Review of Systems  Constitutional: Negative for fever, chills and diaphoresis.  Respiratory: Negative for shortness of breath.   Gastrointestinal: Negative for nausea and vomiting.  Genitourinary: Negative for difficulty urinating.  Musculoskeletal: Negative for myalgias.  Skin: Positive for rash.  Neurological: Negative for headaches.      Allergies  Penicillins  Home Medications   Prior to Admission medications   Medication Sig Start Date End Date Taking? Authorizing Provider  acetaminophen (TYLENOL) 325 MG tablet Take 650 mg by mouth every 6 (six) hours as needed.    Historical Provider, MD  oxyCODONE-acetaminophen (PERCOCET/ROXICET) 5-325 MG per tablet Take 1-2 tablets by mouth every 6 (six) hours as needed for severe pain. 06/02/14   Marshia Ly, PA-C   Triage Vitals: BP 114/64 mmHg  Pulse 88  Temp(Src) 98 F (36.7 C) (Oral)  Resp  20  SpO2 100%  LMP 11/15/2014 (Exact Date)   Physical Exam  Constitutional: She is oriented to person, place, and time. She appears well-developed and well-nourished. No distress.  HENT:  Head: Normocephalic and atraumatic.  Mouth/Throat: Oropharynx is clear and moist and mucous membranes are normal.  Eyes: Conjunctivae and EOM are normal.  Neck: Neck supple. No tracheal deviation present.  Cardiovascular: Normal rate, regular rhythm and normal  heart sounds.   Pulmonary/Chest: Effort normal. No respiratory distress.  Musculoskeletal: Normal range of motion.  Neurological: She is alert and oriented to person, place, and time.  Skin: Skin is warm and dry.  Multiple small vesicular lesion noted to bilateral forearm and upper arm.  Similar vesicular lesion noted to the left and right lower extremities.  No joint involvement.   No rash in palms of hands, soles of feet or oral mucosa.  Psychiatric: She has a normal mood and affect. Her behavior is normal.  Nursing note and vitals reviewed.   ED Course  Procedures (including critical care time)  DIAGNOSTIC STUDIES: Oxygen Saturation is 100% on RA, normal by my interpretation.    COORDINATION OF CARE: 2:50 PM- Suspect contact dermatitis. Will prescribe steroids. Advise pt to use benadryl and continue applying calamine lotion to the areas. Will give pt referral to dermatologist if symptoms persist. Pt understands and agrees with this plan.   Labs Review Labs Reviewed - No data to display  Imaging Review No results found.   EKG Interpretation None      MDM   Final diagnoses:  Contact dermatitis   BP 114/64 mmHg  Pulse 88  Temp(Src) 98 F (36.7 C) (Oral)  Resp 20  SpO2 100%  LMP 11/15/2014 (Exact Date)   I personally performed the services described in this documentation, which was scribed in my presence. The recorded information has been reviewed and is accurate.       Fayrene Helper, PA-C 12/25/14 1500  Richardean Canal, MD 12/25/14 [phone]

## 2015-04-24 ENCOUNTER — Encounter (HOSPITAL_COMMUNITY): Payer: Self-pay

## 2015-04-24 ENCOUNTER — Emergency Department (HOSPITAL_COMMUNITY)
Admission: EM | Admit: 2015-04-24 | Discharge: 2015-04-24 | Disposition: A | Discharge status: Home / Self Care | Payer: No Typology Code available for payment source | Attending: Emergency Medicine | Admitting: Emergency Medicine

## 2015-04-24 DIAGNOSIS — Y998 Other external cause status: Secondary | ICD-10-CM | POA: Diagnosis not present

## 2015-04-24 DIAGNOSIS — Z880 Allergy status to penicillin: Secondary | ICD-10-CM | POA: Insufficient documentation

## 2015-04-24 DIAGNOSIS — S0990XA Unspecified injury of head, initial encounter: Secondary | ICD-10-CM | POA: Diagnosis not present

## 2015-04-24 DIAGNOSIS — H539 Unspecified visual disturbance: Secondary | ICD-10-CM | POA: Diagnosis not present

## 2015-04-24 DIAGNOSIS — Y9389 Activity, other specified: Secondary | ICD-10-CM | POA: Diagnosis not present

## 2015-04-24 DIAGNOSIS — G44309 Post-traumatic headache, unspecified, not intractable: Secondary | ICD-10-CM

## 2015-04-24 DIAGNOSIS — Y92410 Unspecified street and highway as the place of occurrence of the external cause: Secondary | ICD-10-CM | POA: Insufficient documentation

## 2015-04-24 MED ORDER — IBUPROFEN 800 MG PO TABS: 800.0000 mg | ORAL_TABLET | Freq: Three times a day (TID) | ORAL | Status: DC

## 2015-04-24 MED ORDER — IBUPROFEN 800 MG PO TABS
800.0000 mg | ORAL_TABLET | Freq: Once | ORAL | Status: AC
  Administered 2015-04-24: 800 mg via ORAL
  Filled 2015-04-24: qty 1

## 2015-04-24 MED ORDER — METHOCARBAMOL 500 MG PO TABS
500.0000 mg | ORAL_TABLET | Freq: Once | ORAL | Status: AC
  Administered 2015-04-24: 500 mg via ORAL
  Filled 2015-04-24: qty 1

## 2015-04-24 MED ORDER — METHOCARBAMOL 500 MG PO TABS: 500.0000 mg | ORAL_TABLET | Freq: Two times a day (BID) | ORAL | Status: DC
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
## 2015-04-24 NOTE — ED Notes (Signed)
ED PA at bedside

## 2016-06-18 ENCOUNTER — Emergency Department (HOSPITAL_BASED_OUTPATIENT_CLINIC_OR_DEPARTMENT_OTHER)
Admission: EM | Admit: 2016-06-18 | Discharge: 2016-06-18 | Disposition: A | Discharge status: Home / Self Care | Payer: Self-pay | Attending: Emergency Medicine | Admitting: Emergency Medicine

## 2016-06-18 ENCOUNTER — Encounter (HOSPITAL_BASED_OUTPATIENT_CLINIC_OR_DEPARTMENT_OTHER): Payer: Self-pay | Admitting: *Deleted

## 2016-06-18 ENCOUNTER — Emergency Department (HOSPITAL_BASED_OUTPATIENT_CLINIC_OR_DEPARTMENT_OTHER): Payer: Self-pay

## 2016-06-18 DIAGNOSIS — N201 Calculus of ureter: Secondary | ICD-10-CM | POA: Insufficient documentation

## 2016-06-18 HISTORY — DX: Calculus of kidney: N20.0

## 2016-06-18 LAB — URINE MICROSCOPIC-ADD ON

## 2016-06-18 LAB — URINALYSIS, ROUTINE W REFLEX MICROSCOPIC
GLUCOSE, UA: NEGATIVE mg/dL
KETONES UR: NEGATIVE mg/dL
Leukocytes, UA: NEGATIVE
NITRITE: NEGATIVE
PH: 5.5 (ref 5.0–8.0)
Protein, ur: 100 mg/dL — AB

## 2016-06-18 LAB — PREGNANCY, URINE: Preg Test, Ur: NEGATIVE

## 2016-06-18 MED ORDER — SODIUM CHLORIDE 0.9 % IV SOLN
Freq: Once | INTRAVENOUS | Status: AC
  Administered 2016-06-18: 04:00:00 via INTRAVENOUS

## 2016-06-18 MED ORDER — ONDANSETRON 4 MG PO TBDP: 4.0000 mg | ORAL_TABLET | Freq: Once | ORAL | Status: DC

## 2016-06-18 MED ORDER — HYDROMORPHONE HCL 1 MG/ML IJ SOLN
1.0000 mg | Freq: Once | INTRAMUSCULAR | Status: AC
  Administered 2016-06-18: 1 mg via INTRAVENOUS

## 2016-06-18 MED ORDER — TAMSULOSIN HCL 0.4 MG PO CAPS: ORAL_CAPSULE | ORAL | 1 refills | Status: DC

## 2016-06-18 MED ORDER — HYDROMORPHONE HCL 4 MG PO TABS: 4.0000 mg | ORAL_TABLET | ORAL | 0 refills | Status: DC | PRN

## 2016-06-18 MED ORDER — ONDANSETRON 8 MG PO TBDP: 8.0000 mg | ORAL_TABLET | Freq: Three times a day (TID) | ORAL | 0 refills | Status: DC | PRN

## 2016-06-18 MED ORDER — TAMSULOSIN HCL 0.4 MG PO CAPS
0.4000 mg | ORAL_CAPSULE | Freq: Once | ORAL | Status: AC
  Administered 2016-06-18: 0.4 mg via ORAL

## 2016-06-18 MED ORDER — PROMETHAZINE HCL 25 MG/ML IJ SOLN: 12.5000 mg | Freq: Once | INTRAMUSCULAR | Status: DC

## 2016-06-18 MED ORDER — ONDANSETRON HCL 4 MG/2ML IJ SOLN
4.0000 mg | Freq: Once | INTRAMUSCULAR | Status: AC
  Administered 2016-06-18: 4 mg via INTRAVENOUS

## 2016-06-18 NOTE — ED Notes (Signed)
ED Provider at bedside. 

## 2016-06-18 NOTE — ED Notes (Signed)
Patient transported to CT 

## 2016-06-18 NOTE — ED Triage Notes (Signed)
Pt reports around 8pm tonight she had onset of left sided abdominal pain and nausea. Now, also having burning with urination and urgency. Denies fevers.

## 2016-06-18 NOTE — ED Provider Notes (Signed)
[facility] Provider Note: Tammy DellJ. Lane Gleen Ripberger, MD, FACEP  CSN: [id number] MRN: [id number] ARRIVAL: 06/18/16 at 0303 ROOM: MH03/[facility]03   CHIEF COMPLAINT  Abdominal Pain   HISTORY OF PRESENT ILLNESS  Tammy SilkBrittany Debellis is a 30 y.o. female with a remote history of ureterolithiasis. She is here with left-sided abdominal pain which came on suddenly about 8 PM yesterday evening. The pain is severe and radiates somewhat to the left flank. It is worse with movement or palpation. There has been associated nausea, discomfort with urination and urinary urgency. She has not noted hematuria. She has not had a fever. The pain is similar to previous kidney stone.   Past Medical History:  Diagnosis Date  . Kidney stone   . Knee pain, right 05/2014    Past Surgical History:  Procedure Laterality Date  . BREAST ENHANCEMENT SURGERY Bilateral 2008  . CHONDROPLASTY Right 06/02/2014   Procedure: CHONDROPLASTY lateral medial condyle;  Surgeon: Harvie JuniorJohn L Graves, MD;  Location: [facility];  Service: Orthopedics;  Laterality: Right;  . KNEE ARTHROSCOPY WITH EXCISION PLICA Right 06/02/2014   Procedure: KNEE ARTHROSCOPY WITH EXCISION PLICA;  Surgeon: Harvie JuniorJohn L Graves, MD;  Location: [facility];  Service: Orthopedics;  Laterality: Right;    No family history on file.  Social History  Substance Use Topics  . Smoking status: Never Smoker  . Smokeless tobacco: Never Used  . Alcohol use No    Prior to Admission medications   Not on File    Allergies Penicillins   REVIEW OF SYSTEMS  Negative except as noted here or in the History of Present Illness.   PHYSICAL EXAMINATION  Initial Vital Signs Blood pressure 128/83, pulse (!) 58, temperature 97.4 F (36.3 C), resp. rate 16, height 5\' 7"  (1.702 m), weight 195 lb (88.5 kg), last menstrual period 05/18/2016, SpO2 100 %.  Examination General: Well-developed, well-nourished female in no acute distress; appearance  consistent with age of record HENT: normocephalic; atraumatic Eyes: pupils equal, round and reactive to light; extraocular muscles intact Neck: supple Heart: regular rate and rhythm Lungs: clear to auscultation bilaterally Abdomen: soft; nondistended; left-sided tenderness; no masses or hepatosplenomegaly; bowel sounds present GU: Mild left CVA tenderness Extremities: No deformity; full range of motion; pulses normal Neurologic: Awake, alert and oriented; motor function intact in all extremities and symmetric; no facial droop Skin: Warm and dry Psychiatric: Flat affect   RESULTS  Summary of this visit's results, reviewed by myself:   EKG Interpretation  Date/Time:    Ventricular Rate:    PR Interval:    QRS Duration:   QT Interval:    QTC Calculation:   R Axis:     Text Interpretation:        Laboratory Studies: Results for orders placed or performed during the hospital encounter of 06/18/16 (from the past 24 hour(s))  Urinalysis, Routine w reflex microscopic ([facility])     Status: Abnormal   Collection Time: 06/18/16  3:48 AM  Result Value Ref Range   Color, Urine AMBER (A) YELLOW   APPearance TURBID (A) CLEAR   Specific Gravity, Urine >1.046 (H) 1.005 - 1.030   pH 5.5 5.0 - 8.0   Glucose, UA NEGATIVE NEGATIVE mg/dL   Hgb urine dipstick LARGE (A) NEGATIVE   Bilirubin Urine SMALL (A) NEGATIVE   Ketones, ur NEGATIVE NEGATIVE mg/dL   Protein, ur 782100 (A) NEGATIVE mg/dL   Nitrite NEGATIVE NEGATIVE   Leukocytes, UA NEGATIVE NEGATIVE  Pregnancy, urine  Status: None   Collection Time: 06/18/16  3:48 AM  Result Value Ref Range   Preg Test, Ur NEGATIVE NEGATIVE  Urine microscopic-add on     Status: Abnormal   Collection Time: 06/18/16  3:48 AM  Result Value Ref Range   Squamous Epithelial / LPF 0-5 (A) NONE SEEN   WBC, UA 0-5 0 - 5 WBC/hpf   RBC / HPF 6-30 0 - 5 RBC/hpf   Bacteria, UA FEW (A) NONE SEEN   Crystals CA OXALATE CRYSTALS (A) NEGATIVE   Urine-Other  AMORPHOUS URATES/PHOSPHATES    Imaging Studies: Ct Renal Stone Study  Result Date: 06/18/2016 CLINICAL DATA:  Acute onset of left-sided abdominal pain and nausea. Dysuria and increased urinary urgency. Initial encounter. EXAM: CT ABDOMEN AND PELVIS WITHOUT CONTRAST TECHNIQUE: Multidetector CT imaging of the abdomen and pelvis was performed following the standard protocol without IV contrast. COMPARISON:  CT of the abdomen and pelvis from 01/22/2006 FINDINGS: Lower chest: The visualized lung bases are grossly clear. The visualized portions of the mediastinum are unremarkable. Bilateral breast implants are partially imaged. Hepatobiliary: The liver is unremarkable in appearance. The gallbladder is unremarkable in appearance. The common bile duct remains normal in caliber. Pancreas: The pancreas is within normal limits. Spleen: The spleen is unremarkable in appearance. Adrenals/Urinary Tract: The adrenal glands are unremarkable in appearance. Mild left-sided hydronephrosis is noted, with diffuse distention of the left ureter to the level of an obstructing 4 mm stone distally at the left vesicoureteral junction. Nonobstructing bilateral renal stones are noted, measuring up to 3 mm in size. Minimal left-sided perinephric stranding is noted. Stomach/Bowel: The stomach is unremarkable in appearance. The small bowel is within normal limits. The appendix is normal in caliber, without evidence of appendicitis. The colon is unremarkable in appearance. Vascular/Lymphatic: The abdominal aorta is unremarkable in appearance. The inferior vena cava is grossly unremarkable. No retroperitoneal lymphadenopathy is seen. No pelvic sidewall lymphadenopathy is identified. Reproductive: The bladder is decompressed and not well assessed. The uterus is grossly unremarkable in appearance. The ovaries are relatively symmetric. No suspicious adnexal masses are seen. Other: No additional soft tissue abnormalities are seen. Musculoskeletal:  No acute osseous abnormalities are identified. The visualized musculature is unremarkable in appearance. IMPRESSION: 1. Mild left-sided hydronephrosis, with obstructing 4 mm stone noted distally at the left vesicoureteral junction. 2. Nonobstructing small bilateral renal stones, measuring up to 3 mm in size. Electronically Signed   By: Roanna RaiderJeffery  Chang M.D.   On: 06/18/2016 04:46    ED COURSE  Nursing notes and initial vitals signs, including pulse oximetry, reviewed.  Vitals:   06/18/16 0312 06/18/16 0313 06/18/16 0449  BP: 128/83  114/71  Pulse: (!) 58  70  Resp: 16  18  Temp: 97.4 F (36.3 C)    SpO2: 100%  99%  Weight:  195 lb (88.5 kg)   Height:  5\' 7"  (1.702 m)    5:49 AM Patient's pain nausea controlled with IV medications. She does not currently have a urologist. We will refer.  PROCEDURES    ED DIAGNOSES     ICD-9-CM ICD-10-CM   1. Ureterolithiasis 592.1 N20.1        Paula LibraJohn Smantha Boakye, MD 06/18/16 [phone]

## 2019-01-14 ENCOUNTER — Ambulatory Visit: Payer: Self-pay | Admitting: Obstetrics & Gynecology

## 2019-01-21 ENCOUNTER — Other Ambulatory Visit: Payer: Self-pay

## 2019-01-22 ENCOUNTER — Ambulatory Visit (INDEPENDENT_AMBULATORY_CARE_PROVIDER_SITE_OTHER): Payer: 59 | Admitting: Obstetrics & Gynecology

## 2019-01-22 ENCOUNTER — Encounter: Payer: Self-pay | Admitting: Obstetrics & Gynecology

## 2019-01-22 VITALS — BP 124/78 | Ht 66.0 in | Wt 217.0 lb

## 2019-01-22 DIAGNOSIS — Z1151 Encounter for screening for human papillomavirus (HPV): Secondary | ICD-10-CM | POA: Diagnosis not present

## 2019-01-22 DIAGNOSIS — Z01419 Encounter for gynecological examination (general) (routine) without abnormal findings: Secondary | ICD-10-CM

## 2019-01-22 DIAGNOSIS — Z789 Other specified health status: Secondary | ICD-10-CM | POA: Diagnosis not present

## 2019-01-22 DIAGNOSIS — E6609 Other obesity due to excess calories: Secondary | ICD-10-CM

## 2019-01-22 DIAGNOSIS — Z6835 Body mass index (BMI) 35.0-35.9, adult: Secondary | ICD-10-CM

## 2019-01-22 NOTE — Progress Notes (Signed)
[name] August 27, 1985 [id number]   History:    33 y.o. G4P2A2L2 Single.  Daughter 77 yo (I delivered her), son 34 yo.  RP:  New patient presenting for annual gyn exam   HPI: Menses regular normal.  No breakthrough bleeding.  No pelvic pain.  No abnormal vaginal discharge.  Condoms as needed.  Currently abstinent.  Urine and bowel movements normal.  Breasts normal.  Body mass index 35.02.  Planning to exercise more and decrease calories in nutrition.  Will f/u here for fasting health labs.  Past medical history,surgical history, family history and social history were all reviewed and documented in the EPIC chart.  Gynecologic History Patient's last menstrual period was 01/10/2019. Contraception: condoms Last Pap: >10 yrs ago Last mammogram: Never Bone Density: Never Colonoscopy: Never  Obstetric History OB History  Gravida Para Term Preterm AB Living  _0 SAB TAB Ectopic Multiple Live Births               # Outcome Date GA Lbr Len/2nd Weight Sex Delivery Anes PTL Lv  4 AB           3 AB           2 Para           1 Para              ROS: A ROS was performed and pertinent positives and negatives are included in the history.  GENERAL: No fevers or chills. HEENT: No change in vision, no earache, sore throat or sinus congestion. NECK: No pain or stiffness. CARDIOVASCULAR: No chest pain or pressure. No palpitations. PULMONARY: No shortness of breath, cough or wheeze. GASTROINTESTINAL: No abdominal pain, nausea, vomiting or diarrhea, melena or bright red blood per rectum. GENITOURINARY: No urinary frequency, urgency, hesitancy or dysuria. MUSCULOSKELETAL: No joint or muscle pain, no back pain, no recent trauma. DERMATOLOGIC: No rash, no itching, no lesions. ENDOCRINE: No polyuria, polydipsia, no heat or cold intolerance. No recent change in weight. HEMATOLOGICAL: No anemia or easy bruising or bleeding. NEUROLOGIC: No headache, seizures, numbness, tingling or weakness.  PSYCHIATRIC: No depression, no loss of interest in normal activity or change in sleep pattern.     Exam:   BP 124/78   Ht _1  (1.676 m)   Wt 217 lb (98.4 kg)   LMP 01/10/2019 Comment: no birth control  BMI 35.02 kg/m   Body mass index is 35.02 kg/m.  General appearance : Well developed well nourished female. No acute distress HEENT: Eyes: no retinal hemorrhage or exudates,  Neck supple, trachea midline, no carotid bruits, no thyroidmegaly Lungs: Clear to auscultation, no rhonchi or wheezes, or rib retractions  Heart: Regular rate and rhythm, no murmurs or gallops Breast:Examined in sitting and supine position were symmetrical in appearance, no palpable masses or tenderness,  no skin retraction, no nipple inversion, no nipple discharge, no skin discoloration, no axillary or supraclavicular lymphadenopathy Abdomen: no palpable masses or tenderness, no rebound or guarding Extremities: no edema or skin discoloration or tenderness  Pelvic: Vulva: Normal             Vagina: No gross lesions or discharge  Cervix: No gross lesions or discharge.  Pap/HPV HR done  Uterus  AV, normal size, shape and consistency, non-tender and mobile  Adnexa  Without masses or tenderness  Anus: Normal   Assessment/Plan:  33 y.o. female for annual exam   1. Encounter for routine  gynecological examination with Papanicolaou smear of cervix Normal gynecologic exam.  Pap with high-risk HPV done today.  Breast exam normal.  We will follow-up for fasting health labs here. - CBC; Future - Comp Met (CMET); Future - TSH; Future - Lipid panel; Future - VITAMIN D 25 Hydroxy (Vit-D Deficiency, Fractures); Future - PAP,TP IMGw/HPV RNA,rflx [id number],18/45  2. Use of condoms for contraception Declines alternative contraception, will use condoms.  3. Class 2 obesity due to excess calories without serious comorbidity with body mass index (BMI) of 35.0 to 35.9 in adult Recommend a lower calorie/carb diet such as  [employer].  Aerobic physical activities 5 times a week and weightlifting every 2 days.  4. Special screening examination for human papillomavirus (HPV) - PAP,TP IMGw/HPV RNA,rflx [id number]/35  Princess Bruins MD, 2:40 PM 01/22/2019

## 2019-01-23 ENCOUNTER — Other Ambulatory Visit: Payer: 59

## 2019-01-23 ENCOUNTER — Other Ambulatory Visit: Payer: Self-pay

## 2019-01-23 DIAGNOSIS — Z01419 Encounter for gynecological examination (general) (routine) without abnormal findings: Secondary | ICD-10-CM

## 2019-01-23 LAB — PAP, TP IMAGING W/ HPV RNA, RFLX HPV TYPE 16,18/45: HPV DNA High Risk: NOT DETECTED

## 2019-01-24 ENCOUNTER — Encounter: Payer: Self-pay | Admitting: Obstetrics & Gynecology

## 2019-01-24 LAB — COMPREHENSIVE METABOLIC PANEL
AG Ratio: 1.5 (calc) (ref 1.0–2.5)
ALT: 12 U/L (ref 6–29)
AST: 16 U/L (ref 10–30)
Albumin: 4 g/dL (ref 3.6–5.1)
Alkaline phosphatase (APISO): 65 U/L (ref 31–125)
BUN: 14 mg/dL (ref 7–25)
CO2: 28 mmol/L (ref 20–32)
Calcium: 9.1 mg/dL (ref 8.6–10.2)
Chloride: 105 mmol/L (ref 98–110)
Creat: 0.88 mg/dL (ref 0.50–1.10)
Globulin: 2.6 g/dL (calc) (ref 1.9–3.7)
Glucose, Bld: 90 mg/dL (ref 65–99)
Potassium: 4.3 mmol/L (ref 3.5–5.3)
Sodium: 138 mmol/L (ref 135–146)
Total Bilirubin: 0.4 mg/dL (ref 0.2–1.2)
Total Protein: 6.6 g/dL (ref 6.1–8.1)

## 2019-01-24 LAB — CBC
HCT: 40.5 % (ref 35.0–45.0)
Hemoglobin: 13.5 g/dL (ref 11.7–15.5)
MCH: 28.8 pg (ref 27.0–33.0)
MCHC: 33.3 g/dL (ref 32.0–36.0)
MCV: 86.5 fL (ref 80.0–100.0)
MPV: 11.7 fL (ref 7.5–12.5)
Platelets: 217 10*3/uL (ref 140–400)
RBC: 4.68 10*6/uL (ref 3.80–5.10)
RDW: 12.8 % (ref 11.0–15.0)
WBC: 4.9 10*3/uL (ref 3.8–10.8)

## 2019-01-24 LAB — LIPID PANEL
Cholesterol: 136 mg/dL (ref <200)
HDL: 53 mg/dL (ref >=50)
LDL Cholesterol (Calc): 69 mg/dL (calc)
Non-HDL Cholesterol (Calc): 83 mg/dL (calc) (ref <130)
Total CHOL/HDL Ratio: 2.6 (calc) (ref <5.0)
Triglycerides: 60 mg/dL (ref <150)

## 2019-01-24 LAB — TSH: TSH: 2.52 mIU/L

## 2019-01-24 LAB — VITAMIN D 25 HYDROXY (VIT D DEFICIENCY, FRACTURES): Vit D, 25-Hydroxy: 26 ng/mL — ABNORMAL LOW (ref 30–100)

## 2019-01-24 NOTE — Patient Instructions (Signed)
1. Encounter for routine gynecological examination with Papanicolaou smear of cervix Normal gynecologic exam.  Pap with high-risk HPV done today.  Breast exam normal.  We will follow-up for fasting health labs here. - CBC; Future - Comp Met (CMET); Future - TSH; Future - Lipid panel; Future - VITAMIN D 25 Hydroxy (Vit-D Deficiency, Fractures); Future - PAP,TP IMGw/HPV RNA,rflx HPVTYPE16,18/45  2. Use of condoms for contraception Declines alternative contraception, will use condoms.  3. Class 2 obesity due to excess calories without serious comorbidity with body mass index (BMI) of 35.0 to 35.9 in adult Recommend a lower calorie/carb diet such as [employer].  Aerobic physical activities 5 times a week and weightlifting every 2 days.  4. Special screening examination for human papillomavirus (HPV) - PAP,TP IMGw/HPV RNA,rflx [id number]/83  [place], it was a pleasure seeing you today!  I will inform you of your results as soon as they are available.

## 2019-05-09 ENCOUNTER — Encounter: Payer: Self-pay | Admitting: Podiatry

## 2019-05-09 ENCOUNTER — Other Ambulatory Visit: Payer: Self-pay

## 2019-05-09 ENCOUNTER — Ambulatory Visit (INDEPENDENT_AMBULATORY_CARE_PROVIDER_SITE_OTHER): Payer: 59

## 2019-05-09 ENCOUNTER — Ambulatory Visit (INDEPENDENT_AMBULATORY_CARE_PROVIDER_SITE_OTHER): Payer: 59 | Admitting: Podiatry

## 2019-05-09 VITALS — BP 128/80 | HR 77

## 2019-05-09 DIAGNOSIS — M659 Synovitis and tenosynovitis, unspecified: Secondary | ICD-10-CM

## 2019-05-09 DIAGNOSIS — M25571 Pain in right ankle and joints of right foot: Secondary | ICD-10-CM | POA: Diagnosis not present

## 2019-05-09 DIAGNOSIS — M779 Enthesopathy, unspecified: Secondary | ICD-10-CM | POA: Diagnosis not present

## 2019-05-09 MED ORDER — METHYLPREDNISOLONE 4 MG PO TBPK: ORAL_TABLET | ORAL | 0 refills | Status: DC

## 2019-05-12 NOTE — Progress Notes (Signed)
Subjective:   Patient ID: [name], female   DOB: 33 y.o.   MRN: [id number]   HPI 33 year old female presents the office for concerns of right ankle pain and swelling which spent on the for last 3 weeks.  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  She denies any specific injury.  She gets along the lateral aspect of the ankle but no redness or warmth.  She has tried ibuprofen and ice without any significant improvement.  She has no other concerns today.   Review of Systems  All other systems reviewed and are negative.  Past Medical History:  Diagnosis Date  . Kidney stone   . Knee pain, right 05/2014    Past Surgical History:  Procedure Laterality Date  . BREAST ENHANCEMENT SURGERY Bilateral 2008  . CHONDROPLASTY Right 06/02/2014   Procedure: CHONDROPLASTY lateral medial condyle;  Surgeon: Alta Corning, MD;  Location: [facility];  Service: Orthopedics;  Laterality: Right;  . KNEE ARTHROSCOPY WITH EXCISION PLICA Right 40/98/1191   Procedure: KNEE ARTHROSCOPY WITH EXCISION PLICA;  Surgeon: Alta Corning, MD;  Location: [facility];  Service: Orthopedics;  Laterality: Right;     Current Outpatient Medications:  .  methylPREDNISolone (MEDROL DOSEPAK) 4 MG TBPK tablet, Take as directed, Disp: 21 tablet, Rfl: 0 .  phentermine (ADIPEX-P) 37.5 MG tablet, Adipex-P 37.5 mg tablet  Take 1 tablet every day by oral route in the morning., Disp: , Rfl:  .  valACYclovir (VALTREX) 500 MG tablet, Take by mouth., Disp: , Rfl:   Allergies  Allergen Reactions  . Penicillins Hives    Has patient had a PCN reaction causing immediate rash, facial/tongue/throat swelling, SOB or lightheadedness with hypotension: No Has patient had a PCN reaction causing severe rash involving mucus membranes or skin necrosis: No Has patient had a PCN reaction that required hospitalization No Has patient had  a PCN reaction occurring within the last 10 years: No If all of the above answers are "NO", then may proceed with Cephalosporin use. *can take amoxicillin*          Objective:  Physical Exam  General: AAO x3, NAD  Dermatological: Skin is warm, dry and supple bilateral. Nails x 10 are well manicured; remaining integument appears unremarkable at this time. There are no open sores, no preulcerative lesions, no rash or signs of infection present.  Vascular: Dorsalis Pedis artery and Posterior Tibial artery pedal pulses are 2/4 bilateral with immedate capillary fill time. There is no pain with calf compression, swelling, warmth, erythema.   Neruologic: Grossly intact via light touch bilateral. Vibratory intact via tuning fork bilateral. Protective threshold with Semmes Wienstein monofilament intact to all pedal sites bilateral.  Musculoskeletal: There is tenderness to the lateral aspect of the ankle normal course of the ATFL.  Anterior drawer, talar tilt test is negative.  Tenderness to lateral aspect on sinus tarsi.  This is where the majority of tenderness is localized.  There is localized edema there is no erythema or warmth.  No crepitation with ankle joint range of motion.  Mild decrease in medial arch upon weightbearing.  Muscular strength 5/5 in all groups tested bilateral.  Gait: Unassisted, Nonantalgic.       Assessment:   Right ankle, subtalar joint capsulitis     Plan:  -Treatment options discussed including all alternatives, risks, and complications -Etiology of symptoms were discussed -  X-rays were obtained and reviewed with the patient.  No evidence of acute fracture or stress fracture identified today. -Discussed steroid injection.  She wishes to hold off.  Prescribed Medrol Dosepak.  Tri-Lock ankle brace for now.  We will check orthotic coverage.  Vivi Barrack DPM

## 2019-05-13 ENCOUNTER — Telehealth: Payer: Self-pay | Admitting: *Deleted

## 2019-05-13 NOTE — Telephone Encounter (Signed)
Left message informing pt that I would send an email stating she was seen in our office on 05/09/2019.

## 2019-05-13 NOTE — Telephone Encounter (Signed)
Pt states she was seen by Dr. 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

## 2019-05-16 ENCOUNTER — Ambulatory Visit: Payer: 59 | Admitting: Podiatry

## 2019-06-06 ENCOUNTER — Other Ambulatory Visit: Payer: Self-pay

## 2019-06-06 ENCOUNTER — Ambulatory Visit (INDEPENDENT_AMBULATORY_CARE_PROVIDER_SITE_OTHER): Payer: 59 | Admitting: Podiatry

## 2019-06-06 DIAGNOSIS — M779 Enthesopathy, unspecified: Secondary | ICD-10-CM | POA: Diagnosis not present

## 2019-06-06 DIAGNOSIS — M25571 Pain in right ankle and joints of right foot: Secondary | ICD-10-CM

## 2019-06-12 NOTE — Progress Notes (Signed)
Subjective: 33 year old female presents the office today for follow-up evaluation of right ankle pain.  She states that it is still achy feeling and tender to touch.  The steroids did help decrease the swelling but the discomfort is still present.  She was wearing an ankle brace which was not helpful.  She denies any recent injury or trauma that started after moving her feet more. Denies any systemic complaints such as fevers, chills, nausea, vomiting. No acute changes since last appointment, and no other complaints at this time.   Objective: AAO x3, NAD DP/PT pulses palpable bilaterally, CRT less than 3 seconds Tenderness palpation of the lateral aspect of the foot on sinus tarsi on the right side.  No discomfort with subtalar range of motion.  No pain with ankle joint.  Flexor, extensor tendons appear to be intact.  No gross ankle instability present.  Anterior drawer, talar tilt test is negative.  No open lesions or pre-ulcerative lesions.  No pain with calf compression, swelling, warmth, erythema  Assessment: Subtalar joint capsulitis right foot  Plan: -All treatment options discussed with the patient including all alternatives, risks, complications.  -Steroid injection performed.  See procedure below.  Continue with ankle brace for now.  Discussed wearing more supportive shoes and inserts.  If symptoms continue consider MRI. -Patient encouraged to call the office with any questions, concerns, change in symptoms.   Procedure: Injection intermediate joint Discussed alternatives, risks, complications and verbal consent was obtained.  Location: Right sinus tarsi Skin Prep: Betadine. Injectate: 0.5cc 0.5% marcaine plain, 0.5 cc 2% lidocaine plain and, 1 cc kenalog 10. Disposition: Patient tolerated procedure well. Injection site dressed with a band-aid.  Post-injection care was discussed and return precautions discussed.   Return in about 4 weeks (around 07/04/2019).  Trula Slade  DPM

## 2019-06-27 ENCOUNTER — Other Ambulatory Visit: Payer: Self-pay

## 2019-06-27 DIAGNOSIS — Z20822 Contact with and (suspected) exposure to covid-19: Secondary | ICD-10-CM

## 2019-06-29 LAB — NOVEL CORONAVIRUS, NAA: SARS-CoV-2, NAA: NOT DETECTED

## 2019-07-04 ENCOUNTER — Ambulatory Visit: Payer: 59 | Admitting: Podiatry

## 2019-08-08 ENCOUNTER — Ambulatory Visit: Payer: Self-pay | Attending: Internal Medicine

## 2019-08-08 DIAGNOSIS — U071 COVID-19: Secondary | ICD-10-CM | POA: Insufficient documentation

## 2019-08-08 DIAGNOSIS — Z20822 Contact with and (suspected) exposure to covid-19: Secondary | ICD-10-CM

## 2019-08-09 LAB — NOVEL CORONAVIRUS, NAA: SARS-CoV-2, NAA: DETECTED — AB

## 2019-08-10 ENCOUNTER — Telehealth: Payer: Self-pay | Admitting: Physician Assistant

## 2019-08-10 NOTE — Telephone Encounter (Signed)
Called to discuss with [name] [facility] about Covid symptoms and the use of bamlanivimab, a monoclonal antibody infusion for those with mild to moderate Covid symptoms and at a high risk of hospitalization.     Pt is not qualified for this infusion due to lack of identified risk factors and co-morbid conditions.  Symptoms reviewed as well as criteria for ending isolation.  Symptoms reviewed that would warrant ED/Hospital evaluation as well should her condition worsen. Preventative practices reviewed. Patient verbalized understanding.   Manson Passey, [place]

## 2019-08-22 ENCOUNTER — Other Ambulatory Visit: Payer: Self-pay

## 2019-10-24 ENCOUNTER — Telehealth: Payer: Self-pay | Admitting: *Deleted

## 2019-10-24 MED ORDER — VALACYCLOVIR HCL 1 G PO TABS: ORAL_TABLET | ORAL | 1 refills | Status: DC

## 2019-10-24 NOTE — Telephone Encounter (Signed)
Did she have an outbreak recently?  If yes, can send Valacyclovir 1 g PO daily x 5 days.  #10, refill x 1.

## 2019-10-24 NOTE — Telephone Encounter (Signed)
Yes patient is current outbreak, Rx sent.

## 2019-10-24 NOTE — Telephone Encounter (Signed)
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 Please advise

## 2020-01-03 ENCOUNTER — Other Ambulatory Visit: Payer: Self-pay | Admitting: Orthopedic Surgery

## 2020-01-03 DIAGNOSIS — M79642 Pain in left hand: Secondary | ICD-10-CM

## 2020-01-03 DIAGNOSIS — S60222A Contusion of left hand, initial encounter: Secondary | ICD-10-CM

## 2020-02-13 ENCOUNTER — Other Ambulatory Visit: Payer: Self-pay

## 2020-02-13 ENCOUNTER — Ambulatory Visit
Admission: RE | Admit: 2020-02-13 | Discharge: 2020-02-13 | Disposition: A | Discharge status: Home / Self Care | Payer: BC Managed Care – PPO | Source: Ambulatory Visit | Attending: Orthopedic Surgery | Admitting: Orthopedic Surgery

## 2020-02-13 DIAGNOSIS — M79642 Pain in left hand: Secondary | ICD-10-CM

## 2020-02-13 DIAGNOSIS — S60222A Contusion of left hand, initial encounter: Secondary | ICD-10-CM

## 2021-09-14 ENCOUNTER — Encounter (HOSPITAL_BASED_OUTPATIENT_CLINIC_OR_DEPARTMENT_OTHER): Payer: Self-pay

## 2021-09-14 ENCOUNTER — Other Ambulatory Visit: Payer: Self-pay

## 2021-09-14 ENCOUNTER — Emergency Department (HOSPITAL_BASED_OUTPATIENT_CLINIC_OR_DEPARTMENT_OTHER)
Admission: EM | Admit: 2021-09-14 | Discharge: 2021-09-15 | Disposition: A | Discharge status: Home / Self Care | Payer: Self-pay | Attending: Emergency Medicine | Admitting: Emergency Medicine

## 2021-09-14 DIAGNOSIS — S39012A Strain of muscle, fascia and tendon of lower back, initial encounter: Secondary | ICD-10-CM | POA: Insufficient documentation

## 2021-09-14 DIAGNOSIS — W07XXXA Fall from chair, initial encounter: Secondary | ICD-10-CM | POA: Insufficient documentation

## 2021-09-14 NOTE — ED Triage Notes (Signed)
Pt reports tail bone/back pain; she states she was standing on a chair when she fell off of it and landed on her bottom on Thursday. She then had a massage today and reports the pain is worse and radiates to her legs.

## 2021-09-15 ENCOUNTER — Emergency Department (HOSPITAL_BASED_OUTPATIENT_CLINIC_OR_DEPARTMENT_OTHER): Payer: Self-pay

## 2021-09-15 DIAGNOSIS — M545 Low back pain, unspecified: Secondary | ICD-10-CM | POA: Diagnosis not present

## 2021-09-15 LAB — PREGNANCY, URINE: Preg Test, Ur: NEGATIVE

## 2021-09-15 MED ORDER — METHOCARBAMOL 500 MG PO TABS: 1000.0000 mg | ORAL_TABLET | Freq: Three times a day (TID) | ORAL | 0 refills | Status: DC | PRN

## 2021-09-15 MED ORDER — METHOCARBAMOL 1000 MG/10ML IJ SOLN
1000.0000 mg | Freq: Once | INTRAMUSCULAR | Status: DC
  Filled 2021-09-15: qty 10

## 2021-09-15 MED ORDER — METHOCARBAMOL 500 MG PO TABS
1000.0000 mg | ORAL_TABLET | Freq: Once | ORAL | Status: AC
  Administered 2021-09-15: 1000 mg via ORAL
  Filled 2021-09-15: qty 2

## 2021-09-15 MED ORDER — MORPHINE SULFATE (PF) 4 MG/ML IV SOLN
4.0000 mg | Freq: Once | INTRAVENOUS | Status: AC
  Administered 2021-09-15: 4 mg via INTRAMUSCULAR
  Filled 2021-09-15: qty 1

## 2021-09-15 NOTE — ED Provider Notes (Signed)
?[facility] EMERGENCY DEPARTMENT ?Provider Note ? ? ?CSN: [id number] ?Arrival date & time: 09/14/21  2333 ? ?  ? ?History ? ?Chief Complaint  ?Patient presents with  ? Back Pain  ? ? ?[name] is a 36 y.o. female. ? ?The history is provided by the patient.  ?Back Pain ?[name] is a 36 y.o. female who presents to the Emergency Department complaining of back.  She presents to the ED for evaluation of low back pain that started one week ago.  She was standing on a kitchen chair at last Tuesday changing a light bulb when she fell and landed directly on her bottom.  She developed low back pain and tailbone pain around that time.  Overall she was able to manage her pain and today went to have a massage.  After the massage her pain has significantly worsened and now she has severe pain and is unable to lay down or sit on the commode without severe pain.  She also has tingling in her left calf.  No urinary incontinence, abdominal pain, bowel incontinence.  No history of back issues.  No weakness.  She does have pain on lifting her legs.  When she lifts her legs it causes severe pain in her bottom.  She has no known medical problems. ?  ? ?Home Medications ?Prior to Admission medications   ?Medication Sig Start Date End Date Taking? Authorizing Provider  ?methocarbamol (ROBAXIN) 500 MG tablet Take 2 tablets (1,000 mg total) by mouth every 8 (eight) hours as needed for muscle spasms. 09/15/21  Yes Tilden Fossa, MD  ?methylPREDNISolone (MEDROL DOSEPAK) 4 MG TBPK tablet Take as directed 05/09/19   Vivi Barrack, DPM  ?phentermine (ADIPEX-P) 37.5 MG tablet Adipex-P 37.5 mg tablet ? Take 1 tablet every day by oral route in the morning.    [provider]  ?valACYclovir (VALTREX) 1000 MG tablet Take one tablet by mouth daily for outbreak for 5 days,then daily as needed. 10/24/19   Genia Del, MD  ?valACYclovir (VALTREX) 500 MG tablet Take by mouth.    [provider]  ?    ? ?Allergies    ?Penicillins   ? ?Review of Systems   ?Review of Systems  ?Musculoskeletal:  Positive for back pain.  ?All other systems reviewed and are negative. ? ?Physical Exam ?Updated Vital Signs ?BP 115/82 (BP Location: Left Arm)   Pulse 84   Temp 98.3 ?F (36.8 ?C)   Resp 19   Ht 5\' 7"  (1.702 m)   Wt 102.1 kg   LMP 08/29/2021 (Exact Date)   SpO2 99%   BMI 35.24 kg/m?  ?Physical Exam ?Vitals and nursing note reviewed.  ?Constitutional:   ?   Appearance: She is well-developed.  ?HENT:  ?   Head: Normocephalic and atraumatic.  ?Cardiovascular:  ?   Rate and Rhythm: Normal rate and regular rhythm.  ?Pulmonary:  ?   Effort: Pulmonary effort is normal. No respiratory distress.  ?Abdominal:  ?   Palpations: Abdomen is soft.  ?   Tenderness: There is no abdominal tenderness. There is no guarding or rebound.  ?Musculoskeletal:  ?   Comments: Tenderness to palpation diffusely across the lower lumbar area and SI region.  2+ DP pulses bilaterally.  ?Skin: ?   General: Skin is warm and dry.  ?Neurological:  ?   Mental Status: She is alert and oriented to person, place, and time.  ?   Comments: Sensation to light touch intact in bilateral lower  extremities.  5 out of 5 strength in bilateral lower extremities in proximal and distal muscle groups.  She does have pain in the low back on flexion of the hip  ?Psychiatric:     ?   Behavior: Behavior normal.  ? ? ?ED Results / Procedures / Treatments   ?Labs ?(all labs ordered are listed, but only abnormal results are displayed) ?Labs Reviewed  ?PREGNANCY, URINE  ? ? ?EKG ?None ? ?Radiology ?DG Lumbar Spine Complete ? ?Result Date: 09/15/2021 ?CLINICAL DATA:  Fall and back pain. EXAM: LUMBAR SPINE - COMPLETE 4+ VIEW COMPARISON:  CT abdomen pelvis dated 06/18/2016. FINDINGS: There is no acute fracture or subluxation of the lumbar spine. The vertebral body heights are maintained. The visualized posterior elements are intact. The soft tissues are unremarkable. IMPRESSION:  Negative. Electronically Signed   By: Elgie Collard M.D.   On: 09/15/2021 01:45   ? ?Procedures ?Procedures  ? ? ?Medications Ordered in ED ?Medications  ?morphine (PF) 4 MG/ML injection 4 mg (4 mg Intramuscular Given 09/15/21 0112)  ?methocarbamol (ROBAXIN) tablet 1,000 mg (1,000 mg Oral Given 09/15/21 0111)  ? ? ?ED Course/ Medical Decision Making/ A&P ?  ?                        ?Medical Decision Making ?Amount and/or Complexity of Data Reviewed ?Labs: ordered. ?Radiology: ordered. ? ?Risk ?Prescription drug management. ? ? ?Patient here for evaluation of worsening low back pain after a fall 1 week ago.  She does have tingling to the left calf but is neurologically intact on evaluation.  No red flags.  Imaging is negative for acute fracture, images personally reviewed.  After treatment with medications in the emergency department her pain is better controlled.  Plan to discharge home with muscle relaxer, OTC NSAIDs, outpatient follow-up and return precautions. ? ? ? ? ? ? ? ?Final Clinical Impression(s) / ED Diagnoses ?Final diagnoses:  ?Strain of lumbar region, initial encounter  ? ? ?Rx / DC Orders ?ED Discharge Orders   ? ?      Ordered  ?  methocarbamol (ROBAXIN) 500 MG tablet  Every 8 hours PRN       ? 09/15/21 0156  ? ?  ?  ? ?  ? ? ?  ?Tilden Fossa, MD ?09/15/21 4656 ? ?

## 2021-09-22 DIAGNOSIS — Z3200 Encounter for pregnancy test, result unknown: Secondary | ICD-10-CM | POA: Diagnosis not present

## 2021-09-22 DIAGNOSIS — Z114 Encounter for screening for human immunodeficiency virus [HIV]: Secondary | ICD-10-CM | POA: Diagnosis not present

## 2021-09-22 DIAGNOSIS — E559 Vitamin D deficiency, unspecified: Secondary | ICD-10-CM | POA: Diagnosis not present

## 2021-09-22 DIAGNOSIS — Z0000 Encounter for general adult medical examination without abnormal findings: Secondary | ICD-10-CM | POA: Diagnosis not present

## 2021-09-22 DIAGNOSIS — Z1159 Encounter for screening for other viral diseases: Secondary | ICD-10-CM | POA: Diagnosis not present

## 2021-09-22 DIAGNOSIS — R0602 Shortness of breath: Secondary | ICD-10-CM | POA: Diagnosis not present

## 2021-09-22 DIAGNOSIS — Z131 Encounter for screening for diabetes mellitus: Secondary | ICD-10-CM | POA: Diagnosis not present

## 2021-09-22 DIAGNOSIS — R5383 Other fatigue: Secondary | ICD-10-CM | POA: Diagnosis not present

## 2021-09-22 DIAGNOSIS — Z1339 Encounter for screening examination for other mental health and behavioral disorders: Secondary | ICD-10-CM | POA: Diagnosis not present

## 2021-09-22 DIAGNOSIS — Z20822 Contact with and (suspected) exposure to covid-19: Secondary | ICD-10-CM | POA: Diagnosis not present

## 2021-10-06 ENCOUNTER — Ambulatory Visit: Payer: 59 | Admitting: Obstetrics & Gynecology

## 2021-10-06 DIAGNOSIS — Z0289 Encounter for other administrative examinations: Secondary | ICD-10-CM

## 2022-05-12 IMAGING — DX DG LUMBAR SPINE COMPLETE 4+V
5 series · 5 of 5 positions shown · non-contrast
Comparison: CT abdomen pelvis dated 06/18/2016.

CLINICAL DATA: Fall and back pain.

EXAM:
LUMBAR SPINE - COMPLETE 4+ VIEW

[l-spine ap]
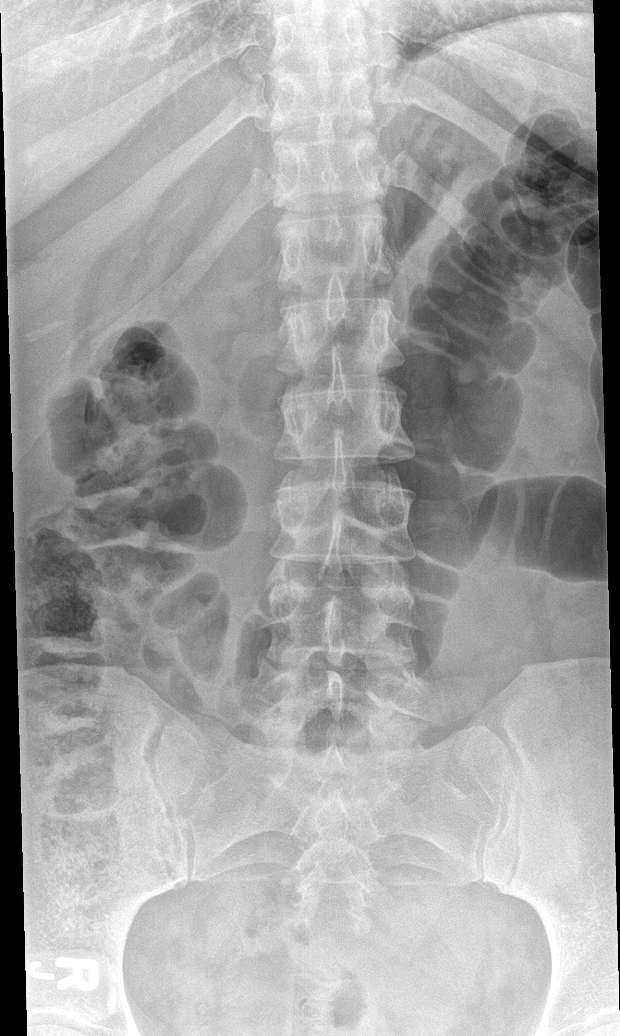

[l-spine obl (1 of 2)]
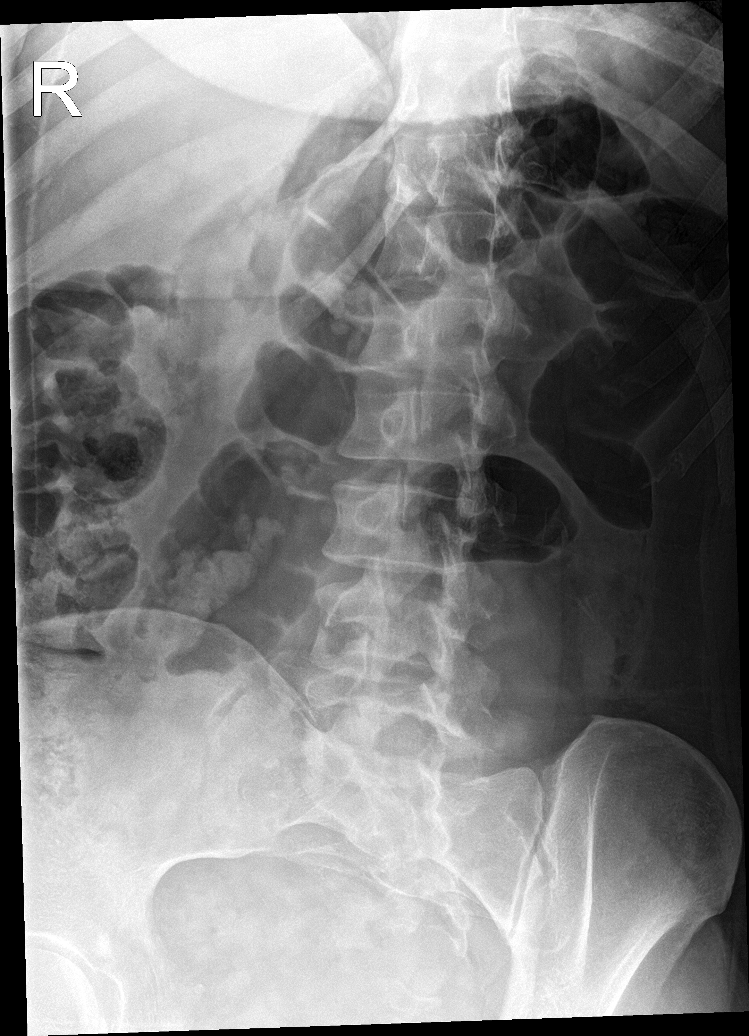

[l-spine obl (2 of 2)]
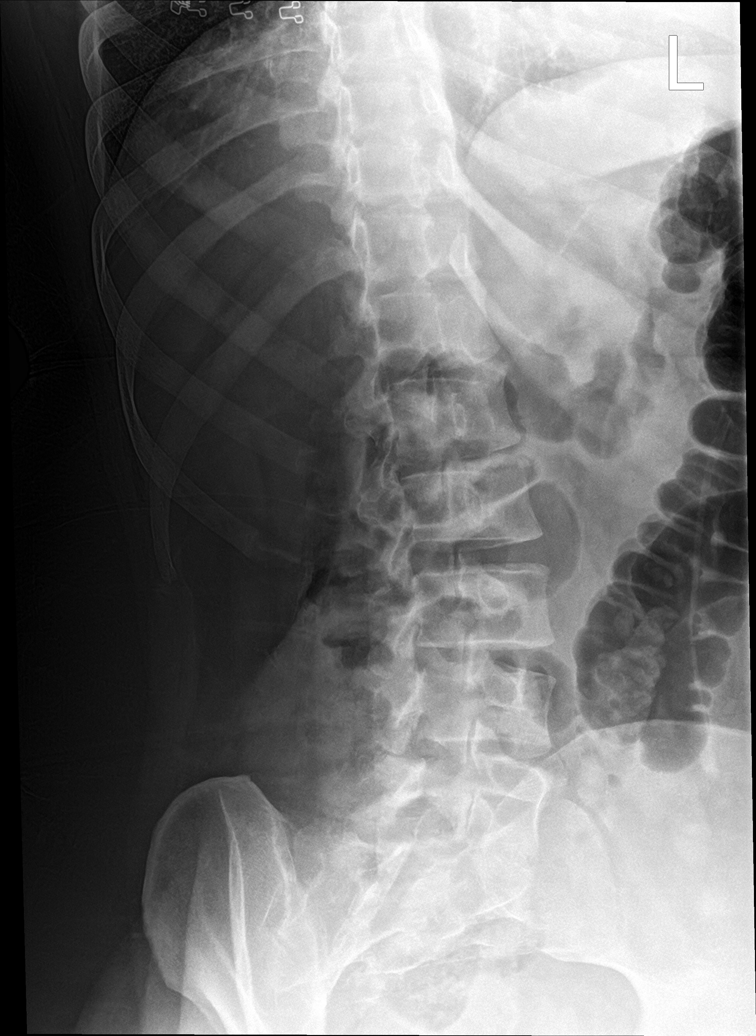

[l-spine lat]
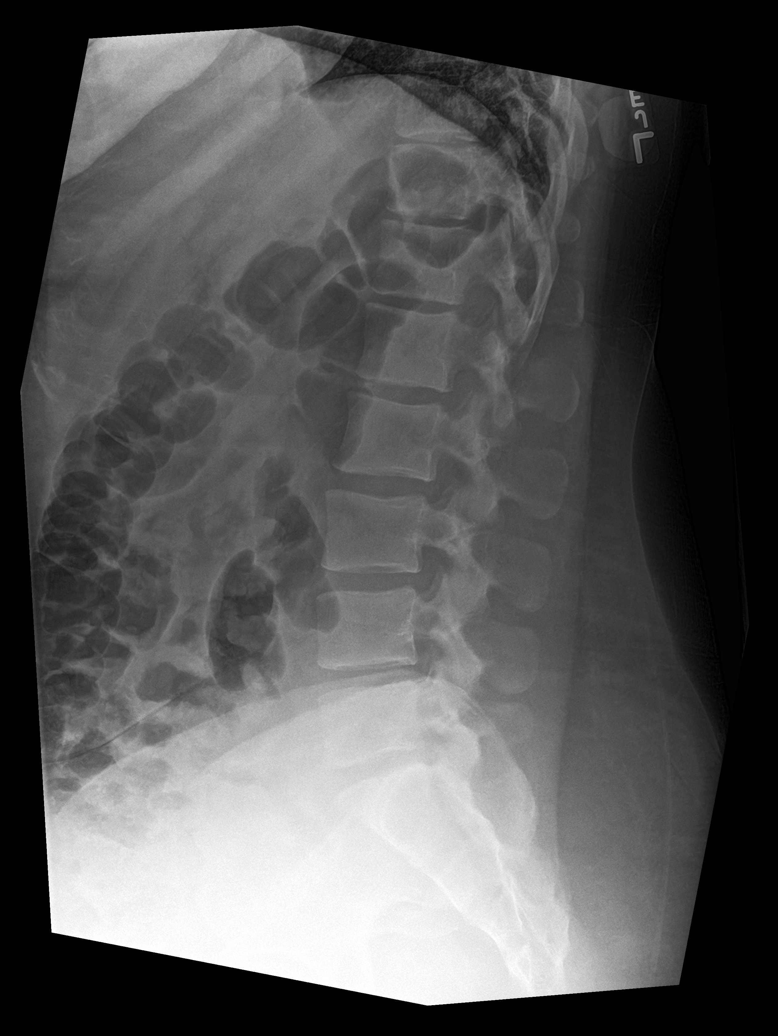

[l-spine spot]
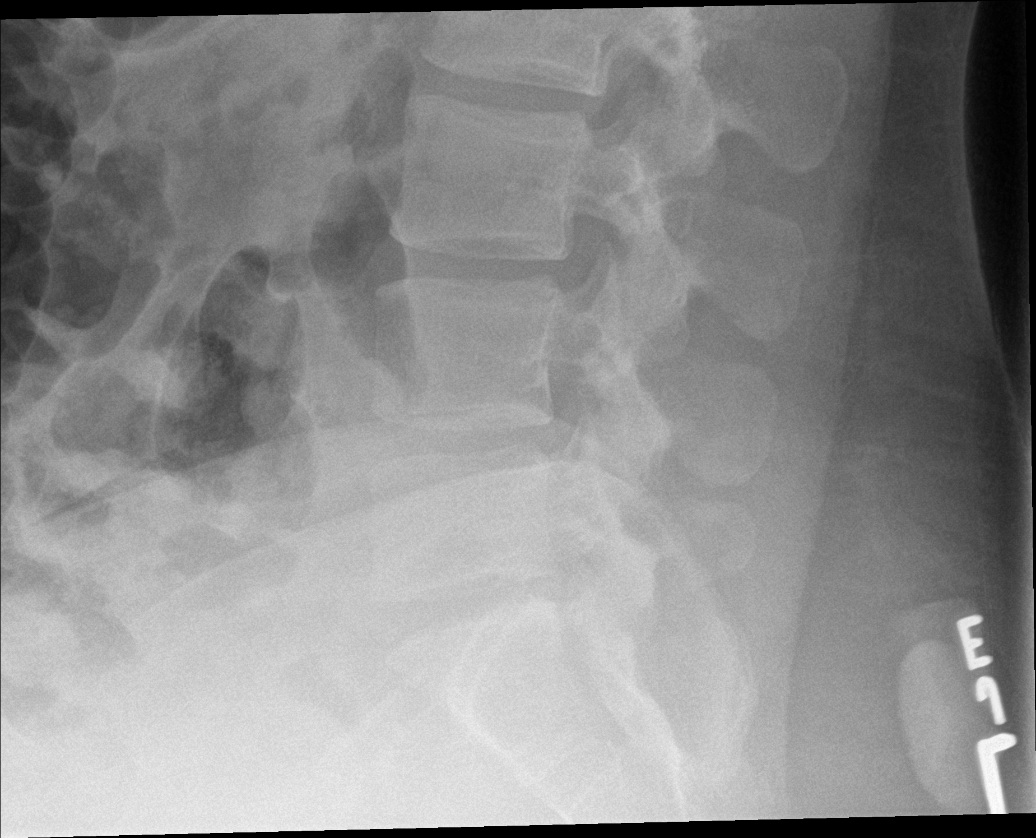

[5 of 5 positions shown; findings below may reference images not displayed]

FINDINGS: There is no acute fracture or subluxation of the lumbar spine. The
vertebral body heights are maintained. The visualized posterior
elements are intact. The soft tissues are unremarkable.
IMPRESSION: Negative.

## 2022-05-25 ENCOUNTER — Encounter: Payer: Self-pay | Admitting: Radiology

## 2022-05-25 ENCOUNTER — Other Ambulatory Visit (HOSPITAL_COMMUNITY)
Admission: RE | Admit: 2022-05-25 | Discharge: 2022-05-25 | Disposition: A | Discharge status: Home / Self Care | Payer: No Typology Code available for payment source | Source: Ambulatory Visit | Attending: Radiology | Admitting: Radiology

## 2022-05-25 ENCOUNTER — Ambulatory Visit (INDEPENDENT_AMBULATORY_CARE_PROVIDER_SITE_OTHER): Payer: No Typology Code available for payment source | Admitting: Radiology

## 2022-05-25 VITALS — BP 102/74 | Ht 66.0 in | Wt 215.0 lb

## 2022-05-25 DIAGNOSIS — Z01419 Encounter for gynecological examination (general) (routine) without abnormal findings: Secondary | ICD-10-CM | POA: Diagnosis present

## 2022-05-25 NOTE — Progress Notes (Signed)
   [name] Nov 18, 1985 [id number]   History:  36 y.o. G4P2 presents for annual exam. No gyn concerns, hx of abnormal paps, last pap 2020, normal. Using condoms for [facility], declines alternative contraception. Labs with PCP.  Gynecologic History Patient's last menstrual period was 04/30/2022 (exact date). Period Cycle (Days): 28 Period Duration (Days): 7 Period Pattern: Regular Menstrual Flow: Moderate Menstrual Control: Tampon Dysmenorrhea: (!) Moderate Dysmenorrhea Symptoms: Cramping Contraception/Family planning: condoms Sexually active: yes Last Pap: 2020. Results were: normal   Obstetric History OB History  Gravida Para Term Preterm AB Living  4 2     2 2   SAB IAB Ectopic Multiple Live Births               # Outcome Date GA Lbr Len/2nd Weight Sex Delivery Anes PTL Lv  4 AB           3 AB           2 Para           1 Para              The following portions of the patient's history were reviewed and updated as appropriate: allergies, current medications, past family history, past medical history, past social history, past surgical history, and problem list.  Review of Systems Pertinent items noted in HPI and remainder of comprehensive ROS otherwise negative.   Past medical history, past surgical history, family history and social history were all reviewed and documented in the EPIC chart.   Exam:  Vitals:   05/25/22 1011  BP: 102/74  Weight: 215 lb (97.5 kg)  Height: 5\' 6"  (1.676 m)   Body mass index is 34.7 kg/m.  General appearance:  Normal Thyroid:  Symmetrical, normal in size, without palpable masses or nodularity. Respiratory  Auscultation:  Clear without wheezing or rhonchi Cardiovascular  Auscultation:  Regular rate, without rubs, murmurs or gallops  Edema/varicosities:  Not grossly evident Abdominal  Soft,nontender, without masses, guarding or rebound.  Liver/spleen:  No organomegaly noted  Hernia:  None appreciated  Skin  Inspection:   Grossly normal Breasts: Examined lying and sitting.   Right: Without masses, retractions, nipple discharge or axillary adenopathy.   Left: Without masses, retractions, nipple discharge or axillary adenopathy. Genitourinary   Inguinal/mons:  Normal without inguinal adenopathy  External genitalia:  Normal appearing vulva with no masses, tenderness, or lesions  BUS/Urethra/Skene's glands:  Normal without masses or exudate  Vagina:  Normal appearing with normal color and discharge, no lesions  Cervix:  Normal appearing without discharge or lesions  Uterus:  Normal in size, shape and contour.  Mobile, nontender  Adnexa/parametria:     Rt: Normal in size, without masses or tenderness.   Lt: Normal in size, without masses or tenderness.  Anus and perineum: Normal   Patient informed chaperone available to be present for breast and pelvic exam. Patient has requested no chaperone to be present. Patient has been advised what will be completed during breast and pelvic exam.   Assessment/Plan:   1. Well woman exam with routine gynecological exam - Cytology - PAP( [place]) -Mammo at 74 - continue condoms for contraception    Discussed SBE, colonoscopy and DEXA screening as directed/appropriate. Recommend of exercise weekly, including weight bearing exercise. Encouraged the use of seatbelts and sunscreen. Return in 1 year for annual or as needed.   41 B WHNP-BC 10:28 AM 05/25/2022

## 2022-05-26 LAB — CYTOLOGY - PAP
Adequacy: ABSENT
Comment: NEGATIVE
Diagnosis: NEGATIVE
High risk HPV: NEGATIVE

## 2022-12-30 DIAGNOSIS — R42 Dizziness and giddiness: Secondary | ICD-10-CM | POA: Diagnosis not present

## 2023-06-20 ENCOUNTER — Ambulatory Visit: Payer: No Typology Code available for payment source | Admitting: Radiology

## 2023-07-21 ENCOUNTER — Ambulatory Visit: Payer: No Typology Code available for payment source | Admitting: Radiology

## 2023-07-25 ENCOUNTER — Ambulatory Visit (INDEPENDENT_AMBULATORY_CARE_PROVIDER_SITE_OTHER): Payer: No Typology Code available for payment source | Admitting: Radiology

## 2023-07-25 ENCOUNTER — Encounter: Payer: Self-pay | Admitting: Radiology

## 2023-07-25 VITALS — BP 110/74 | HR 81 | Ht 66.0 in | Wt 218.0 lb

## 2023-07-25 DIAGNOSIS — B009 Herpesviral infection, unspecified: Secondary | ICD-10-CM

## 2023-07-25 DIAGNOSIS — Z01419 Encounter for gynecological examination (general) (routine) without abnormal findings: Secondary | ICD-10-CM | POA: Diagnosis not present

## 2023-07-25 MED ORDER — VALACYCLOVIR HCL 1 G PO TABS: 1000.0000 mg | ORAL_TABLET | Freq: Two times a day (BID) | ORAL | 1 refills | Status: AC

## 2023-07-25 NOTE — Progress Notes (Signed)
 Markeisha Eagon November 10, 1985 [id number]   History:  38 y.o. G4P2 presents for annual exam. No gyn concerns, hx of abnormal paps, last paps 2020, 2023, normal. Using condoms for [facility], declines alternative contraception. Needs a new PCP. Needs refill on valtrex . Interested in weight management options.   Gynecologic History Patient's last menstrual period was 07/07/2023 (approximate). Period Cycle (Days): 28 Period Duration (Days): 5 Period Pattern: Regular Menstrual Flow: Moderate Menstrual Control: Tampon Dysmenorrhea: (!) Moderate Dysmenorrhea Symptoms: Cramping Contraception/Family planning: condoms Sexually active: yes Last Pap: 2020. Results were: normal   Obstetric History OB History  Gravida Para Term Preterm AB Living  4 2   2 2   SAB IAB Ectopic Multiple Live Births          # Outcome Date GA Lbr Len/2nd Weight Sex Type Anes PTL Lv  4 AB           3 AB           2 Para           1 Para              The following portions of the patient's history were reviewed and updated as appropriate: allergies, current medications, past family history, past medical history, past social history, past surgical history, and problem list.  Review of Systems Pertinent items noted in HPI and remainder of comprehensive ROS otherwise negative.   Past medical history, past surgical history, family history and social history were all reviewed and documented in the EPIC chart.   Exam:  Vitals:   07/25/23 1432  BP: 110/74  Pulse: 81  SpO2: 95%  Weight: 218 lb (98.9 kg)  Height: 5' 6 (1.676 m)   Body mass index is 35.19 kg/m.  General appearance:  Normal Thyroid:  Symmetrical, normal in size, without palpable masses or nodularity. Respiratory  Auscultation:  Clear without wheezing or rhonchi Cardiovascular  Auscultation:  Regular rate, without rubs, murmurs or gallops  Edema/varicosities:  Not grossly evident Abdominal  Soft,nontender, without masses, guarding or  rebound.  Liver/spleen:  No organomegaly noted  Hernia:  None appreciated  Skin  Inspection:  Grossly normal Breasts: Examined lying and sitting. Implants under muscle.  Right: Without masses, retractions, nipple discharge or axillary adenopathy.   Left: Without masses, retractions, nipple discharge or axillary adenopathy. Genitourinary   Inguinal/mons:  Normal without inguinal adenopathy  External genitalia:  Normal appearing vulva with no masses, tenderness, or lesions  BUS/Urethra/Skene's glands:  Normal without masses or exudate  Vagina:  Normal appearing with normal color and discharge, no lesions  Cervix:  Normal appearing without discharge or lesions  Uterus:  Normal in size, shape and contour.  Mobile, nontender  Adnexa/parametria:     Rt: Normal in size, without masses or tenderness.   Lt: Normal in size, without masses or tenderness.  Anus and perineum: Normal   Darice Hoit, CMA present for exam  Assessment/Plan:   1. Well woman exam with routine gynecological exam (Primary) Pap 2026 Schedule mammo after 09/08/23   2. HSV (herpes simplex virus) infection - valACYclovir  (VALTREX ) 1000 MG tablet; Take 1 tablet (1,000 mg total) by mouth 2 (two) times daily for 3 days. As needed for outbreak  Dispense: 90 tablet; Refill: 1    Discussed SBE,mammogram and pap screening as directed/appropriate. Recommend of exercise weekly, including weight bearing exercise.  Return in 1 year for annual or as needed.  Will check with insurance company to see what they cover for weight  management.  GINETTE COZIER B WHNP-BC 2:37 PM 07/25/2023

## 2023-08-23 DIAGNOSIS — J45909 Unspecified asthma, uncomplicated: Secondary | ICD-10-CM | POA: Diagnosis not present

## 2023-08-23 DIAGNOSIS — J020 Streptococcal pharyngitis: Secondary | ICD-10-CM | POA: Diagnosis not present

## 2023-08-23 DIAGNOSIS — J101 Influenza due to other identified influenza virus with other respiratory manifestations: Secondary | ICD-10-CM | POA: Diagnosis not present

## 2023-09-03 DIAGNOSIS — J069 Acute upper respiratory infection, unspecified: Secondary | ICD-10-CM | POA: Diagnosis not present

## 2024-03-15 ENCOUNTER — Other Ambulatory Visit: Payer: Self-pay

## 2024-03-15 DIAGNOSIS — B009 Herpesviral infection, unspecified: Secondary | ICD-10-CM

## 2024-03-15 MED ORDER — VALACYCLOVIR HCL 1 G PO TABS: 1000.0000 mg | ORAL_TABLET | Freq: Two times a day (BID) | ORAL | 1 refills | Status: AC

## 2024-03-15 NOTE — Telephone Encounter (Signed)
 Med refill request: Valtrex   Last AEX: 07/25/23 Next AEX: n/a Last MMG (if hormonal med) Refill authorized: last rx 07/25/23 #90 with 1 refill. Pt left message in VM line requesting refill. Please approve or deny

## 2024-07-12 DIAGNOSIS — S134XXA Sprain of ligaments of cervical spine, initial encounter: Secondary | ICD-10-CM | POA: Diagnosis not present
